# Patient Record
Sex: Male | Born: 1970 | Race: White | Hispanic: No | Marital: Married | Smoking: Never smoker
Health system: Southern US, Community
[De-identification: ages and names within clinical notes are randomized; demographics above are authoritative.]

## PROBLEM LIST (undated history)

## (undated) DIAGNOSIS — E785 Hyperlipidemia, unspecified: Secondary | ICD-10-CM

## (undated) DIAGNOSIS — K219 Gastro-esophageal reflux disease without esophagitis: Secondary | ICD-10-CM

## (undated) DIAGNOSIS — G4733 Obstructive sleep apnea (adult) (pediatric): Secondary | ICD-10-CM

## (undated) DIAGNOSIS — I10 Essential (primary) hypertension: Secondary | ICD-10-CM

## (undated) HISTORY — DX: Essential (primary) hypertension: I10

## (undated) HISTORY — DX: Obstructive sleep apnea (adult) (pediatric): G47.33

## (undated) HISTORY — DX: Hyperlipidemia, unspecified: E78.5

## (undated) HISTORY — DX: Gastro-esophageal reflux disease without esophagitis: K21.9

## (undated) HISTORY — PX: OTHER SURGICAL HISTORY: SHX169

## (undated) HISTORY — PX: APPENDECTOMY: SHX54

---

## 2000-02-06 ENCOUNTER — Emergency Department (HOSPITAL_COMMUNITY): Admission: EM | Admit: 2000-02-06 | Discharge: 2000-02-06 | Payer: Self-pay | Admitting: Emergency Medicine

## 2000-02-06 ENCOUNTER — Encounter: Payer: Self-pay | Admitting: Emergency Medicine

## 2010-06-10 ENCOUNTER — Ambulatory Visit
Admission: RE | Admit: 2010-06-10 | Discharge: 2010-06-10 | Payer: Self-pay | Source: Home / Self Care | Attending: Orthopedic Surgery | Admitting: Orthopedic Surgery

## 2010-06-14 LAB — POCT I-STAT, CHEM 8
BUN: 17 mg/dL (ref 6–23)
Calcium, Ion: 1.09 mmol/L — ABNORMAL LOW (ref 1.12–1.32)
Chloride: 102 mEq/L (ref 96–112)
Creatinine, Ser: 1.2 mg/dL (ref 0.4–1.5)
Glucose, Bld: 110 mg/dL — ABNORMAL HIGH (ref 70–99)
HCT: 43 % (ref 39.0–52.0)
Hemoglobin: 14.6 g/dL (ref 13.0–17.0)
Potassium: 3.5 mEq/L (ref 3.5–5.1)
Sodium: 138 mEq/L (ref 135–145)
TCO2: 29 mmol/L (ref 0–100)

## 2010-06-23 NOTE — Op Note (Addendum)
  NAMEAVIEN, TAHA             ACCOUNT NO.:  [id number]  MEDICAL RECORD NO.:  [id number]          PATIENT TYPE:  AMB  LOCATION:  DSC                          FACILITY:  [facility]  PHYSICIAN:  Loreta Ave, M.D. DATE OF BIRTH:  1970/09/26  DATE OF PROCEDURE:  06/10/2010 DATE OF DISCHARGE:                              OPERATIVE REPORT   PREOPERATIVE DIAGNOSIS:  Right ankle post-traumatic impingement, synovitis, meniscoid lesion.  POSTOPERATIVE DIAGNOSIS:  Right ankle post-traumatic impingement, synovitis, meniscoid lesion.  PROCEDURES:  Right ankle exam under anesthesia with fluoroscopic stress views to confirm stability.  Arthroscopy with synovectomy and excision meniscoid lesion.  SURGEON:  Loreta Ave, MD.  ASSISTANT:  Zonia Kief, PA  ANESTHESIA:  General.  BLOOD LOSS:  Minimal.  SPECIMENS:  None.  CULTURES:  None.  COMPLICATIONS:  None.  DRESSING:  Soft compressive.  TOURNIQUET TIME:  30 minutes.  PROCEDURE:  The patient brought to the operating room, placed on the operating table supine position.  After adequate anesthesia had been obtained with fluoroscopic guidance, the ankle was examined.  He did not open significantly to tilt or drawer.  Once this was confirmed, the stirrup and tourniquet applied.  Prepped and draped in usual sterile fashion.  Exsanguinated with elevation Esmarch.  Tourniquet inflated to 350 mmHg.  Anterolateral, anteromedial portals.  Arthroscope introduced, the ankle distended and inspected.  Considerable synovitis all across the entire front of the ankle.  On the lateral side, this was thickened into a meniscoid lesion interposing in the tibiotalar joint.  All of that completely excised.  Also some overgrowth of synovitis at the bottom of the syndesmosis.  That was debrided.  When that was complete, the entire ankle examined.  Articular cartilage looked great throughout. No osteochondral lesions.  No chondromalacia.  No loose  bodies.  Full dorsiflexion without bony impingement confirmed.  Instruments and fluid removed.  Portals injected with Marcaine along the ankle.  Portals closed with nylon.  Sterile compressive dressing applied.  Tourniquet was inflated and removed. Anesthesia reversed.  Brought to recovery room.  Tolerated surgery well. No complications.     Loreta Ave, M.D.     DFM/MEDQ  D:  06/10/2010  T:  06/11/2010  Job:  [id number]  Electronically Signed by Mckinley Jewel M.D. on 06/23/2010 04:20:52 PM

## 2012-09-25 ENCOUNTER — Encounter: Payer: Self-pay | Admitting: Family Medicine

## 2012-09-25 ENCOUNTER — Ambulatory Visit (INDEPENDENT_AMBULATORY_CARE_PROVIDER_SITE_OTHER): Payer: Self-pay | Admitting: Family Medicine

## 2012-09-25 VITALS — BP 136/89 | HR 59 | Temp 97.8°F | Ht 72.0 in | Wt 226.4 lb

## 2012-09-25 DIAGNOSIS — Z0000 Encounter for general adult medical examination without abnormal findings: Secondary | ICD-10-CM

## 2012-09-25 DIAGNOSIS — K219 Gastro-esophageal reflux disease without esophagitis: Secondary | ICD-10-CM

## 2012-09-25 DIAGNOSIS — Z0289 Encounter for other administrative examinations: Secondary | ICD-10-CM

## 2012-09-25 DIAGNOSIS — I10 Essential (primary) hypertension: Secondary | ICD-10-CM

## 2012-09-25 LAB — POCT URINALYSIS DIPSTICK
Bilirubin, UA: NEGATIVE
Blood, UA: NEGATIVE
Glucose, UA: NEGATIVE
Ketones, UA: NEGATIVE
Leukocytes, UA: NEGATIVE
Nitrite, UA: NEGATIVE
Protein, UA: NEGATIVE
Spec Grav, UA: <=1.005
Urobilinogen, UA: NEGATIVE
pH, UA: 7.5

## 2012-09-25 MED ORDER — LOSARTAN POTASSIUM 50 MG PO TABS: 50.0000 mg | ORAL_TABLET | Freq: Every day | ORAL | Status: DC

## 2012-09-25 MED ORDER — ESOMEPRAZOLE MAGNESIUM 40 MG PO CPDR: 40.0000 mg | DELAYED_RELEASE_CAPSULE | Freq: Every day | ORAL | Status: DC

## 2012-09-25 MED ORDER — AMLODIPINE BESYLATE 2.5 MG PO TABS: 2.5000 mg | ORAL_TABLET | Freq: Every day | ORAL | Status: DC

## 2012-09-25 NOTE — Progress Notes (Signed)
Patient ID: Dalton Cummings, male   DOB: 19-Jul-1970, 42 y.o.   MRN: [id number] Performed DOT physical SUBJECTIVE: HPI: Had a delay in approval because of high BP. Now under control. Repeat PEX. Feels fine.  PMH/PSH: reviewed/updated in Epic  SH/FH: reviewed/updated in Epic  Allergies: reviewed/updated in Epic  Medications: reviewed/updated in Epic  Immunizations: reviewed/updated in Epic  ROS: As above in the HPI. All other systems are stable or negative.  OBJECTIVE: APPEARANCE:  Patient in no acute distress.The patient appeared well nourished and normally developed. Acyanotic. Waist: VITAL SIGNS:BP 136/89  Pulse 59  Temp(Src) 97.8 F (36.6 C) (Oral)  Ht 6' (1.829 m)  Wt 226 lb 6.4 oz (102.694 kg)  BMI 30.7 kg/m2   SKIN: warm and  Dry without overt rashes, tattoos and scars  HEAD and Neck: without JVD, Head and scalp: normal Eyes:No scleral icterus. Fundi normal, eye movements normal. Ears: Auricle normal, canal normal, Tympanic membranes normal, insufflation normal. Nose: normal Throat: normal Neck & thyroid: normal  CHEST & LUNGS: Chest wall: normal Lungs: Clear  CVS: Reveals the PMI to be normally located. Regular rhythm, First and Second Heart sounds are normal,  absence of murmurs, rubs or gallops. Peripheral vasculature: Radial pulses: normal Dorsal pedis pulses: normal Posterior pulses: normal  ABDOMEN:  Appearance: normal Benign,, no organomegaly, no masses, no Abdominal Aortic enlargement. No Guarding , no rebound. No Bruits. Bowel sounds: normal  RECTAL: N/A GU: N/A  EXTREMETIES: nonedematous. Both Femoral and Pedal pulses are normal.  MUSCULOSKELETAL:  Spine: normal Joints: intact  NEUROLOGIC: oriented to time,place and person; nonfocal. Strength is normal Sensory is normal Reflexes are normal Cranial Nerves are normal.  ASSESSMENT: Annual physical exam - Plan: POCT urinalysis dipstick  HTN (hypertension) - Plan: losartan  (COZAAR) 50 MG tablet, amLODipine (NORVASC) 2.5 MG tablet  GERD (gastroesophageal reflux disease) - Plan: esomeprazole (NEXIUM) 40 MG capsule  Stable and controlled.  PLAN: Approved for 1 year since his pex was repeated. Orders Placed This Encounter  Procedures  . POCT urinalysis dipstick   Meds ordered this encounter  Medications  . DISCONTD: amLODipine (NORVASC) 2.5 MG tablet    Sig:   . DISCONTD: NEXIUM 40 MG capsule    Sig:   . DISCONTD: losartan (COZAAR) 50 MG tablet    Sig:   . esomeprazole (NEXIUM) 40 MG capsule    Sig: Take 1 capsule (40 mg total) by mouth daily before breakfast.    Dispense:  30 capsule    Refill:  11  . losartan (COZAAR) 50 MG tablet    Sig: Take 1 tablet (50 mg total) by mouth daily.    Dispense:  30 tablet    Refill:  11  . amLODipine (NORVASC) 2.5 MG tablet    Sig: Take 1 tablet (2.5 mg total) by mouth daily.    Dispense:  30 tablet    Refill:  11   RTc in 1 year for DOT PEX  RTC in 3 months for BP follow up.  Forms filled.  Kipper Buch P. [place] Charon, M.D.

## 2013-01-08 ENCOUNTER — Ambulatory Visit (INDEPENDENT_AMBULATORY_CARE_PROVIDER_SITE_OTHER): Payer: BC Managed Care – PPO | Admitting: Family Medicine

## 2013-01-08 ENCOUNTER — Encounter: Payer: Self-pay | Admitting: Family Medicine

## 2013-01-08 VITALS — BP 141/94 | HR 71 | Temp 97.9°F | Ht 72.0 in | Wt 231.2 lb

## 2013-01-08 DIAGNOSIS — E785 Hyperlipidemia, unspecified: Secondary | ICD-10-CM

## 2013-01-08 DIAGNOSIS — I10 Essential (primary) hypertension: Secondary | ICD-10-CM

## 2013-01-08 NOTE — Progress Notes (Signed)
Patient ID: Dalton Cummings, male   DOB: 1970-08-18, 42 y.o.   MRN: [id number] SUBJECTIVE: CC: Chief Complaint  Patient presents with  . Follow-up    4 month follow up reck BP     HPI: Patient is here for follow up of hypertension: denies Headache;deniesChest Pain;denies weakness;denies Shortness of Breath or Orthopnea;denies Visual changes;denies palpitations;denies cough;denies pedal edema;denies symptoms of TIA or stroke; admits to Compliance with medications. denies Problems with medications. Wife checks BP are normal.  Past Medical History  Diagnosis Date  . Hypertension   . GERD (gastroesophageal reflux disease)    Past Surgical History  Procedure Laterality Date  . Appendectomy    . Right ankle  repair    . Left elbow surgery    . Left rotator cuff     History   Social History  . Marital Status: Married    Spouse Name: N/A    Number of Children: N/A  . Years of Education: N/A   Occupational History  . Not on file.   Social History Main Topics  . Smoking status: Never Smoker   . Smokeless tobacco: Not on file  . Alcohol Use: No  . Drug Use: No  . Sexually Active: Not on file   Other Topics Concern  . Not on file   Social History Narrative  . No narrative on file   Family History  Problem Relation Age of Onset  . Diabetes Father   . Heart disease Father    Current Outpatient Prescriptions on File Prior to Visit  Medication Sig Dispense Refill  . amLODipine (NORVASC) 2.5 MG tablet Take 1 tablet (2.5 mg total) by mouth daily.  30 tablet  11  . esomeprazole (NEXIUM) 40 MG capsule Take 1 capsule (40 mg total) by mouth daily before breakfast.  30 capsule  11  . losartan (COZAAR) 50 MG tablet Take 1 tablet (50 mg total) by mouth daily.  30 tablet  11   No current facility-administered medications on file prior to visit.   Allergies  Allergen Reactions  . Codeine   . Penicillins     There is no immunization history on file for this patient. Prior to  Admission medications   Medication Sig Start Date End Date Taking? Authorizing Provider  amLODipine (NORVASC) 2.5 MG tablet Take 1 tablet (2.5 mg total) by mouth daily. 09/25/12  Yes Ileana Ladd, MD  esomeprazole (NEXIUM) 40 MG capsule Take 1 capsule (40 mg total) by mouth daily before breakfast. 09/25/12  Yes Ileana Ladd, MD  losartan (COZAAR) 50 MG tablet Take 1 tablet (50 mg total) by mouth daily. 09/25/12  Yes Ileana Ladd, MD     ROS: As above in the HPI. All other systems are stable or negative.  OBJECTIVE: APPEARANCE:  Patient in no acute distress.The patient appeared well nourished and normally developed. Acyanotic. Waist: VITAL SIGNS:BP 141/94  Pulse 71  Temp(Src) 97.9 F (36.6 C) (Oral)  Ht 6' (1.829 m)  Wt 231 lb 3.2 oz (104.872 kg)  BMI 31.35 kg/m2 WM Recheck BP 126/82 Muscular. SKIN: warm and  Dry without overt rashes, tattoos and scars  HEAD and Neck: without JVD, Head and scalp: normal Eyes:No scleral icterus. Fundi normal, eye movements normal. Ears: Auricle normal, canal normal, Tympanic membranes normal, insufflation normal. Nose: normal Throat: normal Neck & thyroid: normal  CHEST & LUNGS: Chest wall: normal Lungs: Clear  CVS: Reveals the PMI to be normally located. Regular rhythm, First and Second Heart sounds are  normal,  absence of murmurs, rubs or gallops. Peripheral vasculature: Radial pulses: normal Dorsal pedis pulses: normal Posterior pulses: normal  ABDOMEN:  Appearance: normal Benign, no organomegaly, no masses, no Abdominal Aortic enlargement. No Guarding , no rebound. No Bruits. Bowel sounds: normal  RECTAL: N/A GU: N/A  EXTREMETIES: nonedematous. Both Femoral and Pedal pulses are normal.  MUSCULOSKELETAL:  Spine: normal Joints: intact  NEUROLOGIC: oriented to time,place and person; nonfocal. Strength is normal Sensory is normal Reflexes are normal Cranial Nerves are normal.  Results for orders placed in visit  on 09/25/12  POCT URINALYSIS DIPSTICK      Result Value Range   Color, UA yellow     Clarity, UA clear     Glucose, UA neg     Bilirubin, UA neg     Ketones, UA neg     Spec Grav, UA <=1.005     Blood, UA neg     pH, UA 7.5     Protein, UA neg     Urobilinogen, UA negative     Nitrite, UA neg     Leukocytes, UA Negative      ASSESSMENT: HLD (hyperlipidemia) - Plan: NMR, lipoprofile  HTN (hypertension) - Plan: CMP14+EGFR  PLAN: Orders Placed This Encounter  Procedures  . CMP14+EGFR  . NMR, lipoprofile   Reinforced the dietary changes he needs to make.  Continue the same medications.  Return in about 4 months (around 05/10/2013) for Recheck medical problems.  Jeraldin Fesler P. [place] Charon, M.D.

## 2013-01-09 LAB — CMP14+EGFR
ALT: 43 IU/L (ref 0–44)
AST: 31 IU/L (ref 0–40)
Albumin/Globulin Ratio: 2 (ref 1.1–2.5)
Albumin: 4.8 g/dL (ref 3.5–5.5)
Alkaline Phosphatase: 63 IU/L (ref 39–117)
BUN/Creatinine Ratio: 14 (ref 9–20)
BUN: 14 mg/dL (ref 6–24)
CO2: 28 mmol/L (ref 18–29)
Calcium: 10.4 mg/dL — ABNORMAL HIGH (ref 8.7–10.2)
Chloride: 101 mmol/L (ref 97–108)
Creatinine, Ser: 1.02 mg/dL (ref 0.76–1.27)
GFR calc Af Amer: 104 mL/min/{1.73_m2} (ref >59)
GFR calc non Af Amer: 90 mL/min/{1.73_m2} (ref >59)
Globulin, Total: 2.4 g/dL (ref 1.5–4.5)
Glucose: 109 mg/dL — ABNORMAL HIGH (ref 65–99)
Potassium: 5 mmol/L (ref 3.5–5.2)
Sodium: 140 mmol/L (ref 134–144)
Total Bilirubin: 0.7 mg/dL (ref 0.0–1.2)
Total Protein: 7.2 g/dL (ref 6.0–8.5)

## 2013-01-10 ENCOUNTER — Other Ambulatory Visit: Payer: Self-pay | Admitting: Family Medicine

## 2013-01-10 DIAGNOSIS — E785 Hyperlipidemia, unspecified: Secondary | ICD-10-CM

## 2013-01-10 LAB — NMR, LIPOPROFILE
Cholesterol: 201 mg/dL — ABNORMAL HIGH (ref <200)
HDL Cholesterol by NMR: 45 mg/dL (ref >=40)
HDL Particle Number: 35 umol/L (ref >=30.5)
LDL Particle Number: 2199 nmol/L — ABNORMAL HIGH (ref <1000)
LDL Size: 20 nm — ABNORMAL LOW (ref >20.5)
LDLC SERPL CALC-MCNC: 106 mg/dL — ABNORMAL HIGH (ref <100)
LP-IR Score: 87 — ABNORMAL HIGH (ref <=45)
Small LDL Particle Number: 1557 nmol/L — ABNORMAL HIGH (ref <=527)
Triglycerides by NMR: 249 mg/dL — ABNORMAL HIGH (ref <150)

## 2013-01-10 MED ORDER — PRAVASTATIN SODIUM 20 MG PO TABS: 20.0000 mg | ORAL_TABLET | Freq: Every day | ORAL | Status: DC

## 2013-05-16 ENCOUNTER — Ambulatory Visit: Payer: BC Managed Care – PPO | Admitting: Family Medicine

## 2013-06-07 ENCOUNTER — Ambulatory Visit: Payer: BC Managed Care – PPO | Admitting: Family Medicine

## 2013-06-14 ENCOUNTER — Ambulatory Visit: Payer: BC Managed Care – PPO | Admitting: Family Medicine

## 2013-06-17 ENCOUNTER — Ambulatory Visit (INDEPENDENT_AMBULATORY_CARE_PROVIDER_SITE_OTHER): Payer: BC Managed Care – PPO | Admitting: Family Medicine

## 2013-06-17 ENCOUNTER — Encounter: Payer: Self-pay | Admitting: Family Medicine

## 2013-06-17 VITALS — BP 143/83 | HR 72 | Temp 98.8°F | Ht 72.0 in | Wt 229.0 lb

## 2013-06-17 DIAGNOSIS — E663 Overweight: Secondary | ICD-10-CM | POA: Insufficient documentation

## 2013-06-17 DIAGNOSIS — K219 Gastro-esophageal reflux disease without esophagitis: Secondary | ICD-10-CM | POA: Insufficient documentation

## 2013-06-17 DIAGNOSIS — I10 Essential (primary) hypertension: Secondary | ICD-10-CM

## 2013-06-17 DIAGNOSIS — E785 Hyperlipidemia, unspecified: Secondary | ICD-10-CM | POA: Insufficient documentation

## 2013-06-17 MED ORDER — ASPIRIN EC 81 MG PO TBEC: 81.0000 mg | DELAYED_RELEASE_TABLET | Freq: Every day | ORAL | Status: DC

## 2013-06-17 NOTE — Patient Instructions (Addendum)
Dr Paula Libra Recommendations  For nutrition information, I recommend books:  1).Eat to Live by Dr Excell Seltzer. 2).Prevent and Reverse Heart Disease by Dr Karl Luke. 3) Dr Janene Harvey Book:  Program to Reverse Diabetes  Exercise recommendations are:  If unable to walk, then the patient can exercise in a chair 3 times a day. By flapping arms like a bird gently and raising legs outwards to the front.  If ambulatory, the patient can go for walks for 30 minutes 3 times a week. Then increase the intensity and duration as tolerated.  Goal is to try to attain exercise frequency to 5 times a week.  If applicable: Best to perform resistance exercises (machines or weights) 2 days a week and cardio type exercises 3 days per week.   DASH Diet The DASH diet stands for "Dietary Approaches to Stop Hypertension." It is a healthy eating plan that has been shown to reduce high blood pressure (hypertension) in as little as 14 days, while also possibly providing other significant health benefits. These other health benefits include reducing the risk of breast cancer after menopause and reducing the risk of type 2 diabetes, heart disease, colon cancer, and stroke. Health benefits also include weight loss and slowing kidney failure in patients with chronic kidney disease.  DIET GUIDELINES  Limit salt (sodium). Your diet should contain less than 1500 mg of sodium daily.  Limit refined or processed carbohydrates. Your diet should include mostly whole grains. Desserts and added sugars should be used sparingly.  Include small amounts of heart-healthy fats. These types of fats include nuts, oils, and tub margarine. Limit saturated and trans fats. These fats have been shown to be harmful in the body. CHOOSING FOODS  The following food groups are based on a 2000 calorie diet. See your Registered Dietitian for individual calorie needs. Grains and Grain Products (6 to 8 servings daily)  Eat  More Often: Whole-wheat bread, brown rice, whole-grain or wheat pasta, quinoa, popcorn without added fat or salt (air popped).  Eat Less Often: White bread, white pasta, white rice, cornbread. Vegetables (4 to 5 servings daily)  Eat More Often: Fresh, frozen, and canned vegetables. Vegetables may be raw, steamed, roasted, or grilled with a minimal amount of fat.  Eat Less Often/Avoid: Creamed or fried vegetables. Vegetables in a cheese sauce. Fruit (4 to 5 servings daily)  Eat More Often: All fresh, canned (in natural juice), or frozen fruits. Dried fruits without added sugar. One hundred percent fruit juice ( cup [237 mL] daily).  Eat Less Often: Dried fruits with added sugar. Canned fruit in light or heavy syrup. [employer], Fish, and Poultry (2 servings or less daily. One serving is 3 to 4 oz [85-114 g]).  Eat More Often: Ninety percent or leaner ground beef, tenderloin, sirloin. Round cuts of beef, chicken breast, [place] breast. All fish. Grill, bake, or broil your meat. Nothing should be fried.  Eat Less Often/Avoid: Fatty cuts of meat, [place], or chicken leg, thigh, or wing. Fried cuts of meat or fish. Dairy (2 to 3 servings)  Eat More Often: Low-fat or fat-free milk, low-fat plain or light yogurt, reduced-fat or part-skim cheese.  Eat Less Often/Avoid: Milk (whole, 2%).Whole milk yogurt. Full-fat cheeses. Nuts, Seeds, and Legumes (4 to 5 servings per week)  Eat More Often: All without added salt.  Eat Less Often/Avoid: Salted nuts and seeds, canned beans with added salt. Fats and Sweets (limited)  Eat More Often: Vegetable oils, tub  margarines without trans fats, sugar-free gelatin. Mayonnaise and salad dressings.  Eat Less Often/Avoid: Coconut oils, palm oils, butter, stick margarine, cream, half and half, cookies, candy, pie. FOR MORE INFORMATION The Dash Diet Eating Plan: www.dashdiet.org Document Released: 05/05/2011 Document Revised: 08/08/2011 Document Reviewed:  05/05/2011 [facility] Patient Information 2014 [place], [place].   Hypertriglyceridemia  Diet for High blood levels of Triglycerides Most fats in food are triglycerides. Triglycerides in your blood are stored as fat in your body. High levels of triglycerides in your blood may put you at a greater risk for heart disease and stroke.  Normal triglyceride levels are less than 150 mg/dL. Borderline high levels are 150-199 mg/dl. High levels are 200 - 499 mg/dL, and very high triglyceride levels are greater than 500 mg/dL. The decision to treat high triglycerides is generally based on the level. For people with borderline or high triglyceride levels, treatment includes weight loss and exercise. Drugs are recommended for people with very high triglyceride levels. Many people who need treatment for high triglyceride levels have metabolic syndrome. This syndrome is a collection of disorders that often include: insulin resistance, high blood pressure, blood clotting problems, high cholesterol and triglycerides. TESTING PROCEDURE FOR TRIGLYCERIDES  You should not eat 4 hours before getting your triglycerides measured. The normal range of triglycerides is between 10 and 250 milligrams per deciliter (mg/dl). Some people may have extreme levels (1000 or above), but your triglyceride level may be too high if it is above 150 mg/dl, depending on what other risk factors you have for heart disease.  People with high blood triglycerides may also have high blood cholesterol levels. If you have high blood cholesterol as well as high blood triglycerides, your risk for heart disease is probably greater than if you only had high triglycerides. High blood cholesterol is one of the main risk factors for heart disease. CHANGING YOUR DIET  Your weight can affect your blood triglyceride level. If you are more than 20% above your ideal body weight, you may be able to lower your blood triglycerides by losing weight. Eating less and  exercising regularly is the best way to combat this. Fat provides more calories than any other food. The best way to lose weight is to eat less fat. Only 30% of your total calories should come from fat. Less than 7% of your diet should come from saturated fat. A diet low in fat and saturated fat is the same as a diet to decrease blood cholesterol. By eating a diet lower in fat, you may lose weight, lower your blood cholesterol, and lower your blood triglyceride level.  Eating a diet low in fat, especially saturated fat, may also help you lower your blood triglyceride level. Ask your dietitian to help you figure how much fat you can eat based on the number of calories your caregiver has prescribed for you.  Exercise, in addition to helping with weight loss may also help lower triglyceride levels.   Alcohol can increase blood triglycerides. You may need to stop drinking alcoholic beverages.  Too much carbohydrate in your diet may also increase your blood triglycerides. Some complex carbohydrates are necessary in your diet. These may include bread, rice, potatoes, other starchy vegetables and cereals.  Reduce "simple" carbohydrates. These may include pure sugars, candy, honey, and jelly without losing other nutrients. If you have the kind of high blood triglycerides that is affected by the amount of carbohydrates in your diet, you will need to eat less sugar and less high-sugar foods.  Your caregiver can help you with this.  Adding 2-4 grams of fish oil (EPA+ DHA) may also help lower triglycerides. Speak with your caregiver before adding any supplements to your regimen. Following the Diet  Maintain your ideal weight. Your caregivers can help you with a diet. Generally, eating less food and getting more exercise will help you lose weight. Joining a weight control group may also help. Ask your caregivers for a good weight control group in your area.  Eat low-fat foods instead of high-fat foods. This can help  you lose weight too.  These foods are lower in fat. Eat MORE of these:   Dried beans, peas, and lentils.  Egg whites.  Low-fat cottage cheese.  Fish.  Lean cuts of meat, such as round, sirloin, rump, and flank (cut extra fat off meat you fix).  Whole grain breads, cereals and pasta.  Skim and nonfat dry milk.  Low-fat yogurt.  Poultry without the skin.  Cheese made with skim or part-skim milk, such as mozzarella, parmesan, farmers', ricotta, or pot cheese. These are higher fat foods. Eat LESS of these:   Whole milk and foods made from whole milk, such as American, blue, cheddar, monterey jack, and swiss cheese  High-fat meats, such as luncheon meats, sausages, knockwurst, bratwurst, hot dogs, ribs, corned beef, ground pork, and regular ground beef.  Fried foods. Limit saturated fats in your diet. Substituting unsaturated fat for saturated fat may decrease your blood triglyceride level. You will need to read package labels to know which products contain saturated fats.  These foods are high in saturated fat. Eat LESS of these:   Fried pork skins.  Whole milk.  Skin and fat from poultry.  Palm oil.  Butter.  Shortening.  Cream cheese.  Tomasa BlaseBacon.  Margarines and baked goods made from listed oils.  Vegetable shortenings.  Chitterlings.  Fat from meats.  Coconut oil.  Palm kernel oil.  Lard.  Cream.  Sour cream.  Fatback.  Coffee whiteners and non-dairy creamers made with these oils.  Cheese made from whole milk. Use unsaturated fats (both polyunsaturated and monounsaturated) moderately. Remember, even though unsaturated fats are better than saturated fats; you still want a diet low in total fat.  These foods are high in unsaturated fat:   Canola oil.  Sunflower oil.  Mayonnaise.  Almonds.  Peanuts.  Pine nuts.  Margarines made with these oils.  Safflower oil.  Olive oil.  Avocados.  Cashews.  Peanut butter.  Sunflower  seeds.  Soybean oil.  Peanut oil.  Olives.  Pecans.  Walnuts.  Pumpkin seeds. Avoid sugar and other high-sugar foods. This will decrease carbohydrates without decreasing other nutrients. Sugar in your food goes rapidly to your blood. When there is excess sugar in your blood, your liver may use it to make more triglycerides. Sugar also contains calories without other important nutrients.  Eat LESS of these:   Sugar, brown sugar, powdered sugar, jam, jelly, preserves, honey, syrup, molasses, pies, candy, cakes, cookies, frosting, pastries, colas, soft drinks, punches, fruit drinks, and regular gelatin.  Avoid alcohol. Alcohol, even more than sugar, may increase blood triglycerides. In addition, alcohol is high in calories and low in nutrients. Ask for sparkling water, or a diet soft drink instead of an alcoholic beverage. Suggestions for planning and preparing meals   Bake, broil, grill or roast meats instead of frying.  Remove fat from meats and skin from poultry before cooking.  Add spices, herbs, lemon juice or vinegar to vegetables instead  of salt, rich sauces or gravies.  Use a non-stick skillet without fat or use no-stick sprays.  Cool and refrigerate stews and broth. Then remove the hardened fat floating on the surface before serving.  Refrigerate meat drippings and skim off fat to make low-fat gravies.  Serve more fish.  Use less butter, margarine and other high-fat spreads on bread or vegetables.  Use skim or reconstituted non-fat dry milk for cooking.  Cook with low-fat cheeses.  Substitute low-fat yogurt or cottage cheese for all or part of the sour cream in recipes for sauces, dips or congealed salads.  Use half yogurt/half mayonnaise in salad recipes.  Substitute evaporated skim milk for cream. Evaporated skim milk or reconstituted non-fat dry milk can be whipped and substituted for whipped cream in certain recipes.  Choose fresh fruits for dessert instead of  high-fat foods such as pies or cakes. Fruits are naturally low in fat. When Dining Out   Order low-fat appetizers such as fruit or vegetable juice, pasta with vegetables or tomato sauce.  Select clear, rather than cream soups.  Ask that dressings and gravies be served on the side. Then use less of them.  Order foods that are baked, broiled, poached, steamed, stir-fried, or roasted.  Ask for margarine instead of butter, and use only a small amount.  Drink sparkling water, unsweetened tea or coffee, or diet soft drinks instead of alcohol or other sweet beverages. QUESTIONS AND ANSWERS ABOUT OTHER FATS IN THE BLOOD: SATURATED FAT, TRANS FAT, AND CHOLESTEROL What is trans fat? Trans fat is a type of fat that is formed when vegetable oil is hardened through a process called hydrogenation. This process helps makes foods more solid, gives them shape, and prolongs their shelf life. Trans fats are also called hydrogenated or partially hydrogenated oils.  What do saturated fat, trans fat, and cholesterol in foods have to do with heart disease? Saturated fat, trans fat, and cholesterol in the diet all raise the level of LDL "bad" cholesterol in the blood. The higher the LDL cholesterol, the greater the risk for coronary heart disease (CHD). Saturated fat and trans fat raise LDL similarly.  What foods contain saturated fat, trans fat, and cholesterol? High amounts of saturated fat are found in animal products, such as fatty cuts of meat, chicken skin, and full-fat dairy products like butter, whole milk, cream, and cheese, and in tropical vegetable oils such as palm, palm kernel, and coconut oil. Trans fat is found in some of the same foods as saturated fat, such as vegetable shortening, some margarines (especially hard or stick margarine), crackers, cookies, baked goods, fried foods, salad dressings, and other processed foods made with partially hydrogenated vegetable oils. Small amounts of trans fat also  occur naturally in some animal products, such as milk products, beef, and lamb. Foods high in cholesterol include liver, other organ meats, egg yolks, shrimp, and full-fat dairy products. How can I use the new food label to make heart-healthy food choices? Check the Nutrition Facts panel of the food label. Choose foods lower in saturated fat, trans fat, and cholesterol. For saturated fat and cholesterol, you can also use the Percent Daily Value (%DV): 5% DV or less is low, and 20% DV or more is high. (There is no %DV for trans fat.) Use the Nutrition Facts panel to choose foods low in saturated fat and cholesterol, and if the trans fat is not listed, read the ingredients and limit products that list shortening or hydrogenated or partially hydrogenated  vegetable oil, which tend to be high in trans fat. POINTS TO REMEMBER:   Discuss your risk for heart disease with your caregivers, and take steps to reduce risk factors.  Change your diet. Choose foods that are low in saturated fat, trans fat, and cholesterol.  Add exercise to your daily routine if it is not already being done. Participate in physical activity of moderate intensity, like brisk walking, for at least 30 minutes on most, and preferably all days of the week. No time? Break the 30 minutes into three, 10-minute segments during the day.  Stop smoking. If you do smoke, contact your caregiver to discuss ways in which they can help you quit.  Do not use street drugs.  Maintain a normal weight.  Maintain a healthy blood pressure.  Keep up with your blood work for checking the fats in your blood as directed by your caregiver. Document Released: 03/03/2004 Document Revised: 11/15/2011 Document Reviewed: 09/29/2008 [facility] Patient Information 2014 [place], [place].   Gastroesophageal Reflux Disease, Adult Gastroesophageal reflux disease (GERD) happens when acid from your stomach flows up into the esophagus. When acid comes in contact with  the esophagus, the acid causes soreness (inflammation) in the esophagus. Over time, GERD may create small holes (ulcers) in the lining of the esophagus. CAUSES   Increased body weight. This puts pressure on the stomach, making acid rise from the stomach into the esophagus.  Smoking. This increases acid production in the stomach.  Drinking alcohol. This causes decreased pressure in the lower esophageal sphincter (valve or ring of muscle between the esophagus and stomach), allowing acid from the stomach into the esophagus.  Late evening meals and a full stomach. This increases pressure and acid production in the stomach.  A malformed lower esophageal sphincter. Sometimes, no cause is found. SYMPTOMS   Burning pain in the lower part of the mid-chest behind the breastbone and in the mid-stomach area. This may occur twice a week or more often.  Trouble swallowing.  Sore throat.  Dry cough.  Asthma-like symptoms including chest tightness, shortness of breath, or wheezing. DIAGNOSIS  Your caregiver may be able to diagnose GERD based on your symptoms. In some cases, X-rays and other tests may be done to check for complications or to check the condition of your stomach and esophagus. TREATMENT  Your caregiver may recommend over-the-counter or prescription medicines to help decrease acid production. Ask your caregiver before starting or adding any new medicines.  HOME CARE INSTRUCTIONS   Change the factors that you can control. Ask your caregiver for guidance concerning weight loss, quitting smoking, and alcohol consumption.  Avoid foods and drinks that make your symptoms worse, such as:  Caffeine or alcoholic drinks.  Chocolate.  Peppermint or mint flavorings.  Garlic and onions.  Spicy foods.  Citrus fruits, such as oranges, lemons, or limes.  Tomato-based foods such as sauce, chili, salsa, and pizza.  Fried and fatty foods.  Avoid lying down for the 3 hours prior to your  bedtime or prior to taking a nap.  Eat small, frequent meals instead of large meals.  Wear loose-fitting clothing. Do not wear anything tight around your waist that causes pressure on your stomach.  Raise the head of your bed 6 to 8 inches with wood blocks to help you sleep. Extra pillows will not help.  Only take over-the-counter or prescription medicines for pain, discomfort, or fever as directed by your caregiver.  Do not take aspirin, ibuprofen, or other nonsteroidal anti-inflammatory drugs (NSAIDs).  SEEK IMMEDIATE MEDICAL CARE IF:   You have pain in your arms, neck, jaw, teeth, or back.  Your pain increases or changes in intensity or duration.  You develop nausea, vomiting, or sweating (diaphoresis).  You develop shortness of breath, or you faint.  Your vomit is green, yellow, black, or looks like coffee grounds or blood.  Your stool is red, bloody, or black. These symptoms could be signs of other problems, such as heart disease, gastric bleeding, or esophageal bleeding. MAKE SURE YOU:   Understand these instructions.  Will watch your condition.  Will get help right away if you are not doing well or get worse. Document Released: 02/23/2005 Document Revised: 08/08/2011 Document Reviewed: 12/03/2010 [facility] Patient Information 2014 [place], [place].

## 2013-06-17 NOTE — Progress Notes (Signed)
Patient ID: Dalton Cummings, male   DOB: [birth year], 43 y.o.   MRN: [id number] SUBJECTIVE: CC: Chief Complaint  Patient presents with  . Follow-up    4 monthfollow up     HPI: Patient is here for follow up of hyperlipidemia/HTN denies Headache;denies Chest Pain;denies weakness;denies Shortness of Breath and orthopnea;denies Visual changes;denies palpitations;denies cough;denies pedal edema;denies symptoms of TIA or stroke;deniesClaudication symptoms. admits to Compliance with medications; denies Problems with medications.    Past Medical History  Diagnosis Date  . Hypertension   . GERD (gastroesophageal reflux disease)   . Hyperlipidemia    Past Surgical History  Procedure Laterality Date  . Appendectomy    . Right ankle  repair    . Left elbow surgery    . Left rotator cuff     History   Social History  . Marital Status: Married    Spouse Name: N/A    Number of Children: N/A  . Years of Education: N/A   Occupational History  . Not on file.   Social History Main Topics  . Smoking status: Never Smoker   . Smokeless tobacco: Not on file  . Alcohol Use: No  . Drug Use: No  . Sexual Activity: Not on file   Other Topics Concern  . Not on file   Social History Narrative  . No narrative on file   Family History  Problem Relation Age of Onset  . Diabetes Father   . Heart disease Father    Current Outpatient Prescriptions on File Prior to Visit  Medication Sig Dispense Refill  . amLODipine (NORVASC) 2.5 MG tablet Take 1 tablet (2.5 mg total) by mouth daily.  30 tablet  11  . esomeprazole (NEXIUM) 40 MG capsule Take 1 capsule (40 mg total) by mouth daily before breakfast.  30 capsule  11  . losartan (COZAAR) 50 MG tablet Take 1 tablet (50 mg total) by mouth daily.  30 tablet  11  . pravastatin (PRAVACHOL) 20 MG tablet Take 1 tablet (20 mg total) by mouth daily.  90 tablet  3   No current facility-administered medications on file prior to visit.   Allergies   Allergen Reactions  . Codeine   . Penicillins    Immunization History  Administered Date(s) Administered  . Influenza-Unspecified 03/30/2013  . Tdap 10/26/2011   Prior to Admission medications   Medication Sig Start Date End Date Taking? Authorizing Provider  amLODipine (NORVASC) 2.5 MG tablet Take 1 tablet (2.5 mg total) by mouth daily. 09/25/12  Yes Vernie Shanks, MD  esomeprazole (NEXIUM) 40 MG capsule Take 1 capsule (40 mg total) by mouth daily before breakfast. 09/25/12  Yes Vernie Shanks, MD  losartan (COZAAR) 50 MG tablet Take 1 tablet (50 mg total) by mouth daily. 09/25/12  Yes Vernie Shanks, MD  pravastatin (PRAVACHOL) 20 MG tablet Take 1 tablet (20 mg total) by mouth daily. 01/10/13  Yes Vernie Shanks, MD     ROS: As above in the HPI. All other systems are stable or negative.  OBJECTIVE: APPEARANCE:  Patient in no acute distress.The patient appeared well nourished and normally developed. Acyanotic. Waist: VITAL SIGNS:BP 143/83  Pulse 72  Temp(Src) 98.8 F (37.1 C) (Oral)  Ht 6' (1.829 m)  Wt 229 lb (103.874 kg)  BMI 31.05 kg/m2  BP 122/84 RA; 124/86 LA  WM overweight, muscular.  SKIN: warm and  Dry without overt rashes, tattoos and scars  HEAD and Neck: without JVD, Head and scalp: normal  Eyes:No scleral icterus. Fundi normal, eye movements normal. Ears: Auricle normal, canal normal, Tympanic membranes normal, insufflation normal. Nose: normal Throat: normal Neck & thyroid: normal  CHEST & LUNGS: Chest wall: normal Lungs: Clear  CVS: Reveals the PMI to be normally located. Regular rhythm, First and Second Heart sounds are normal,  absence of murmurs, rubs or gallops. Peripheral vasculature: Radial pulses: normal Dorsal pedis pulses: normal Posterior pulses: normal  ABDOMEN:  Appearance: central fat distribution. Benign, no organomegaly, no masses, no Abdominal Aortic enlargement. No Guarding , no rebound. No Bruits. Bowel sounds:  normal  RECTAL: N/A GU: N/A  EXTREMETIES: nonedematous.  MUSCULOSKELETAL:  Spine: normal Joints: intact  NEUROLOGIC: oriented to time,place and person; nonfocal. Strength is normal Sensory is normal Reflexes are normal Cranial Nerves are normal.   Results for orders placed in visit on 01/08/13  CMP14+EGFR      Result Value Range   Glucose 109 (*) 65 - 99 mg/dL   BUN 14  6 - 24 mg/dL   Creatinine, Ser 1.02  0.76 - 1.27 mg/dL   GFR calc non Af Amer 90  >59 mL/min/1.73   GFR calc Af Amer 104  >59 mL/min/1.73   BUN/Creatinine Ratio 14  9 - 20   Sodium 140  134 - 144 mmol/L   Potassium 5.0  3.5 - 5.2 mmol/L   Chloride 101  97 - 108 mmol/L   CO2 28  18 - 29 mmol/L   Calcium 10.4 (*) 8.7 - 10.2 mg/dL   Total Protein 7.2  6.0 - 8.5 g/dL   Albumin 4.8  3.5 - 5.5 g/dL   Globulin, Total 2.4  1.5 - 4.5 g/dL   Albumin/Globulin Ratio 2.0  1.1 - 2.5   Total Bilirubin 0.7  0.0 - 1.2 mg/dL   Alkaline Phosphatase 63  39 - 117 IU/L   AST 31  0 - 40 IU/L   ALT 43  0 - 44 IU/L  NMR, LIPOPROFILE      Result Value Range   LDL Particle Number [id number] (*) <1000 nmol/L   LDLC SERPL CALC-MCNC 106 (*) <100 mg/dL   HDL Cholesterol by NMR 45  >=40 mg/dL   Triglycerides by NMR 249 (*) <150 mg/dL   Cholesterol 201 (*) <200 mg/dL   HDL Particle Number 35.0  >=30.5 umol/L   Small LDL Particle Number [id number] (*) <=527 nmol/L   LDL Size 20.0 (*) >20.5 nm   LP-IR Score 87 (*) <=45    ASSESSMENT:   Hypertension - Plan: CMP14+EGFR  GERD (gastroesophageal reflux disease)  Hyperlipidemia - Plan: CMP14+EGFR, NMR, lipoprofile, aspirin EC 81 MG tablet  Overweight  PLAN:       Dr Paula Libra Recommendations  For nutrition information, I recommend books:  1).Eat to Live by Dr Excell Seltzer. 2).Prevent and Reverse Heart Disease by Dr Karl Luke. 3) Dr Janene Harvey Book:  Program to Reverse Diabetes  Exercise recommendations are:  If unable to walk, then the patient can exercise in  a chair 3 times a day. By flapping arms like a bird gently and raising legs outwards to the front.  If ambulatory, the patient can go for walks for 30 minutes 3 times a week. Then increase the intensity and duration as tolerated.  Goal is to try to attain exercise frequency to 5 times a week.  If applicable: Best to perform resistance exercises (machines or weights) 2 days a week and cardio type exercises 3 days per week.  Discussed and handouts in  the AVS on:  DASH diet Hyperlipidemia GERD    Orders Placed This Encounter  Procedures  . CMP14+EGFR  . NMR, lipoprofile   Meds ordered this encounter  Medications  . aspirin EC 81 MG tablet    Sig: Take 1 tablet (81 mg total) by mouth daily.    Dispense:  30 tablet    Refill:  11  continue the same medications. Weight reduction Monitor BP and  Record and bring at the next visit. May need adjustment to achieve BP reading of 115/75.   There are no discontinued medications. Return in about 3 months (around 09/15/2013) for Recheck medical problems.  Arlie Riker P. Jacelyn Grip, M.D.

## 2013-06-18 LAB — NMR, LIPOPROFILE
Cholesterol: 177 mg/dL (ref <200)
HDL Cholesterol by NMR: 53 mg/dL (ref >=40)
HDL Particle Number: 37.1 umol/L (ref >=30.5)
LDL Particle Number: 1121 nmol/L — ABNORMAL HIGH (ref <1000)
LDL Size: 20.6 nm (ref >20.5)
LDLC SERPL CALC-MCNC: 91 mg/dL (ref <100)
LP-IR Score: 81 — ABNORMAL HIGH (ref <=45)
Small LDL Particle Number: 410 nmol/L (ref <=527)
Triglycerides by NMR: 166 mg/dL — ABNORMAL HIGH (ref <150)

## 2013-06-18 LAB — CMP14+EGFR
ALT: 33 IU/L (ref 0–44)
AST: 24 IU/L (ref 0–40)
Albumin/Globulin Ratio: 1.8 (ref 1.1–2.5)
Albumin: 4.5 g/dL (ref 3.5–5.5)
Alkaline Phosphatase: 56 IU/L (ref 39–117)
BUN/Creatinine Ratio: 18 (ref 9–20)
BUN: 18 mg/dL (ref 6–24)
CO2: 24 mmol/L (ref 18–29)
Calcium: 9.9 mg/dL (ref 8.7–10.2)
Chloride: 101 mmol/L (ref 97–108)
Creatinine, Ser: 0.99 mg/dL (ref 0.76–1.27)
GFR calc Af Amer: 108 mL/min/{1.73_m2} (ref >59)
GFR calc non Af Amer: 94 mL/min/{1.73_m2} (ref >59)
Globulin, Total: 2.5 g/dL (ref 1.5–4.5)
Glucose: 95 mg/dL (ref 65–99)
Potassium: 4.8 mmol/L (ref 3.5–5.2)
Sodium: 141 mmol/L (ref 134–144)
Total Bilirubin: 0.5 mg/dL (ref 0.0–1.2)
Total Protein: 7 g/dL (ref 6.0–8.5)

## 2014-05-06 ENCOUNTER — Other Ambulatory Visit: Payer: Self-pay | Admitting: Nurse Practitioner

## 2014-06-10 ENCOUNTER — Other Ambulatory Visit: Payer: Self-pay | Admitting: Family Medicine

## 2014-06-10 ENCOUNTER — Other Ambulatory Visit: Payer: Self-pay | Admitting: Nurse Practitioner

## 2014-06-12 ENCOUNTER — Other Ambulatory Visit: Payer: Self-pay | Admitting: Family

## 2014-06-12 ENCOUNTER — Other Ambulatory Visit: Payer: Self-pay | Admitting: Nurse Practitioner

## 2014-06-12 ENCOUNTER — Other Ambulatory Visit: Payer: Self-pay | Admitting: Family Medicine

## 2014-06-12 MED ORDER — LOSARTAN POTASSIUM 50 MG PO TABS: ORAL_TABLET | ORAL | Status: DC

## 2014-06-12 MED ORDER — PRAVASTATIN SODIUM 20 MG PO TABS: ORAL_TABLET | ORAL | Status: DC

## 2014-06-12 MED ORDER — AMLODIPINE BESYLATE 2.5 MG PO TABS: ORAL_TABLET | ORAL | Status: DC

## 2014-06-12 MED ORDER — ESOMEPRAZOLE MAGNESIUM 40 MG PO CPDR: DELAYED_RELEASE_CAPSULE | ORAL | Status: DC

## 2014-06-12 NOTE — Telephone Encounter (Signed)
done

## 2014-06-17 ENCOUNTER — Encounter: Payer: Self-pay | Admitting: Family

## 2014-06-17 ENCOUNTER — Ambulatory Visit (INDEPENDENT_AMBULATORY_CARE_PROVIDER_SITE_OTHER): Payer: BLUE CROSS/BLUE SHIELD | Admitting: Family

## 2014-06-17 VITALS — BP 126/86 | HR 82 | Temp 97.9°F | Ht 72.0 in | Wt 228.4 lb

## 2014-06-17 DIAGNOSIS — E785 Hyperlipidemia, unspecified: Secondary | ICD-10-CM

## 2014-06-17 DIAGNOSIS — I10 Essential (primary) hypertension: Secondary | ICD-10-CM

## 2014-06-17 DIAGNOSIS — Z125 Encounter for screening for malignant neoplasm of prostate: Secondary | ICD-10-CM

## 2014-06-17 DIAGNOSIS — Z0000 Encounter for general adult medical examination without abnormal findings: Secondary | ICD-10-CM

## 2014-06-17 DIAGNOSIS — K219 Gastro-esophageal reflux disease without esophagitis: Secondary | ICD-10-CM

## 2014-06-17 MED ORDER — PRAVASTATIN SODIUM 20 MG PO TABS: ORAL_TABLET | ORAL | Status: DC

## 2014-06-17 MED ORDER — AMLODIPINE BESYLATE 2.5 MG PO TABS: ORAL_TABLET | ORAL | Status: DC

## 2014-06-17 MED ORDER — LOSARTAN POTASSIUM 50 MG PO TABS: ORAL_TABLET | ORAL | Status: AC

## 2014-06-17 MED ORDER — ESOMEPRAZOLE MAGNESIUM 40 MG PO CPDR: DELAYED_RELEASE_CAPSULE | ORAL | Status: DC

## 2014-06-17 NOTE — Patient Instructions (Signed)

## 2014-06-17 NOTE — Progress Notes (Signed)
Subjective:    Patient ID: Dalton Cummings, male    DOB: Oct 16, 1970, 44 y.o.   MRN: [id number]  Hypertension This is a chronic problem. The current episode started more than 1 year ago. The problem has been resolved since onset. The problem is controlled. Associated symptoms include palpitations. Pertinent negatives include no anxiety, blurred vision, headaches, peripheral edema or shortness of breath. Risk factors for coronary artery disease include dyslipidemia, male gender and family history. Past treatments include calcium channel blockers. The current treatment provides significant improvement. There is no history of kidney disease, CAD/MI, heart failure or a thyroid problem. There is no history of sleep apnea.  Hyperlipidemia The current episode started more than 1 year ago. The problem is uncontrolled. Recent lipid tests were reviewed and are high. He has no history of diabetes or hypothyroidism. Factors aggravating his hyperlipidemia include fatty foods. Pertinent negatives include no leg pain, myalgias or shortness of breath. Current antihyperlipidemic treatment includes statins. The current treatment provides significant improvement of lipids. Risk factors for coronary artery disease include dyslipidemia, family history, hypertension and male sex.  Gastrophageal Reflux He reports no belching, no choking, no dysphagia, no heartburn, no sore throat or no wheezing. This is a chronic problem. The current episode started more than 1 year ago. The problem occurs rarely. The problem has been resolved. The symptoms are aggravated by certain foods. He has tried a PPI for the symptoms. The treatment provided significant relief.      Review of Systems  Constitutional: Negative.   HENT: Negative.  Negative for sore throat.   Eyes: Negative for blurred vision.  Respiratory: Negative.  Negative for choking, shortness of breath and wheezing.   Cardiovascular: Positive for palpitations.    Gastrointestinal: Negative.  Negative for heartburn and dysphagia.  Endocrine: Negative.   Genitourinary: Negative.   Musculoskeletal: Negative.  Negative for myalgias.  Neurological: Negative.  Negative for headaches.  Hematological: Negative.   Psychiatric/Behavioral: Negative.   All other systems reviewed and are negative.      Objective:   Physical Exam  Constitutional: He is oriented to person, place, and time. He appears well-developed and well-nourished. No distress.  HENT:  Head: Normocephalic.  Right Ear: External ear normal.  Left Ear: External ear normal.  Nose: Nose normal.  Mouth/Throat: Oropharynx is clear and moist.  Eyes: Pupils are equal, round, and reactive to light. Right eye exhibits no discharge. Left eye exhibits no discharge.  Neck: Normal range of motion. Neck supple. No thyromegaly present.  Cardiovascular: Normal rate, regular rhythm, normal heart sounds and intact distal pulses.   No murmur heard. Pulmonary/Chest: Effort normal and breath sounds normal. No respiratory distress. He has no wheezes.  Abdominal: Soft. Bowel sounds are normal. He exhibits no distension. There is no tenderness.  Musculoskeletal: Normal range of motion. He exhibits no edema or tenderness.  Neurological: He is alert and oriented to person, place, and time. He has normal reflexes. No cranial nerve deficit.  Skin: Skin is warm and dry. No rash noted. No erythema.  Psychiatric: He has a normal mood and affect. His behavior is normal. Judgment and thought content normal.  Vitals reviewed.   BP 126/86 mmHg  Pulse 82  Temp(Src) 97.9 F (36.6 C) (Oral)  Ht 6' (1.829 m)  Wt 228 lb 6.4 oz (103.602 kg)  BMI 30.97 kg/m2       Assessment & Plan:  1. Essential hypertension - amLODipine (NORVASC) 2.5 MG tablet; TAKE ONE (1) TABLET EACH DAY  Dispense: 90 tablet; Refill: 4 - losartan (COZAAR) 50 MG tablet; TAKE ONE (1) TABLET EACH DAY  Dispense: 90 tablet; Refill: 4 - CMP14+EGFR;  Future  2. Gastroesophageal reflux disease, esophagitis presence not specified * - esomeprazole (NEXIUM) 40 MG capsule; TAKE ONE CAPSULE EACH MORNING  Dispense: 90 capsule; Refill: 4 - CMP14+EGFR; Future  3. Hyperlipidemia - pravastatin (PRAVACHOL) 20 MG tablet; TAKE ONE (1) TABLET EACH DAY  Dispense: 90 tablet; Refill: 4 - CMP14+EGFR; Future  4. Annual physical exam - CMP14+EGFR; Future - Lipid panel; Future - Vit D  25 hydroxy (rtn osteoporosis monitoring); Future  5. Prostate cancer screening - CMP14+EGFR; Future - PSA, total and free; Future   Continue all meds Labs ordered- Pt to get blood work drawn in next week or so Health Maintenance reviewed Diet and exercise encouraged RTO 1 year  Evelina Dun, FNP

## 2014-06-24 ENCOUNTER — Telehealth: Payer: Self-pay | Admitting: *Deleted

## 2014-06-24 NOTE — Telephone Encounter (Signed)
Christy, I did a PA on Lee's esmeprazole

## 2014-07-18 ENCOUNTER — Other Ambulatory Visit: Payer: Self-pay | Admitting: Family

## 2014-12-04 ENCOUNTER — Other Ambulatory Visit: Payer: Self-pay | Admitting: Family

## 2015-01-16 ENCOUNTER — Other Ambulatory Visit: Payer: Self-pay | Admitting: Family

## 2015-03-10 ENCOUNTER — Other Ambulatory Visit: Payer: Self-pay | Admitting: Family

## 2015-03-10 NOTE — Telephone Encounter (Signed)
Last seen 06/18/14

## 2015-07-17 ENCOUNTER — Other Ambulatory Visit: Payer: Self-pay | Admitting: Family Medicine

## 2015-09-16 DIAGNOSIS — I10 Essential (primary) hypertension: Secondary | ICD-10-CM | POA: Diagnosis not present

## 2015-09-16 DIAGNOSIS — Z6831 Body mass index (BMI) 31.0-31.9, adult: Secondary | ICD-10-CM | POA: Diagnosis not present

## 2015-10-07 ENCOUNTER — Other Ambulatory Visit: Payer: Self-pay | Admitting: Family

## 2015-10-21 DIAGNOSIS — I10 Essential (primary) hypertension: Secondary | ICD-10-CM | POA: Diagnosis not present

## 2015-10-21 DIAGNOSIS — Z6830 Body mass index (BMI) 30.0-30.9, adult: Secondary | ICD-10-CM | POA: Diagnosis not present

## 2015-11-16 ENCOUNTER — Other Ambulatory Visit: Payer: Self-pay | Admitting: Family

## 2016-01-04 ENCOUNTER — Other Ambulatory Visit: Payer: Self-pay | Admitting: Family

## 2016-01-04 DIAGNOSIS — E785 Hyperlipidemia, unspecified: Secondary | ICD-10-CM

## 2016-01-27 DIAGNOSIS — Z6831 Body mass index (BMI) 31.0-31.9, adult: Secondary | ICD-10-CM | POA: Diagnosis not present

## 2016-01-27 DIAGNOSIS — I10 Essential (primary) hypertension: Secondary | ICD-10-CM | POA: Diagnosis not present

## 2016-02-12 ENCOUNTER — Other Ambulatory Visit: Payer: Self-pay | Admitting: Family

## 2016-03-21 ENCOUNTER — Other Ambulatory Visit: Payer: Self-pay | Admitting: Family

## 2016-06-10 DIAGNOSIS — R05 Cough: Secondary | ICD-10-CM | POA: Diagnosis not present

## 2016-06-10 DIAGNOSIS — I10 Essential (primary) hypertension: Secondary | ICD-10-CM | POA: Diagnosis not present

## 2016-06-10 DIAGNOSIS — Z6831 Body mass index (BMI) 31.0-31.9, adult: Secondary | ICD-10-CM | POA: Diagnosis not present

## 2016-08-18 DIAGNOSIS — R740 Nonspecific elevation of levels of transaminase and lactic acid dehydrogenase [LDH]: Secondary | ICD-10-CM | POA: Diagnosis not present

## 2016-08-18 DIAGNOSIS — I10 Essential (primary) hypertension: Secondary | ICD-10-CM | POA: Diagnosis not present

## 2016-11-09 DIAGNOSIS — K219 Gastro-esophageal reflux disease without esophagitis: Secondary | ICD-10-CM | POA: Diagnosis not present

## 2016-11-09 DIAGNOSIS — I10 Essential (primary) hypertension: Secondary | ICD-10-CM | POA: Diagnosis not present

## 2016-11-09 DIAGNOSIS — E782 Mixed hyperlipidemia: Secondary | ICD-10-CM | POA: Diagnosis not present

## 2016-11-09 DIAGNOSIS — Z6831 Body mass index (BMI) 31.0-31.9, adult: Secondary | ICD-10-CM | POA: Diagnosis not present

## 2017-03-07 DIAGNOSIS — Z6831 Body mass index (BMI) 31.0-31.9, adult: Secondary | ICD-10-CM | POA: Diagnosis not present

## 2017-03-07 DIAGNOSIS — K219 Gastro-esophageal reflux disease without esophagitis: Secondary | ICD-10-CM | POA: Diagnosis not present

## 2017-03-07 DIAGNOSIS — I10 Essential (primary) hypertension: Secondary | ICD-10-CM | POA: Diagnosis not present

## 2017-03-07 DIAGNOSIS — Z23 Encounter for immunization: Secondary | ICD-10-CM | POA: Diagnosis not present

## 2017-03-07 DIAGNOSIS — E782 Mixed hyperlipidemia: Secondary | ICD-10-CM | POA: Diagnosis not present

## 2017-04-04 DIAGNOSIS — D485 Neoplasm of uncertain behavior of skin: Secondary | ICD-10-CM | POA: Diagnosis not present

## 2017-04-04 DIAGNOSIS — L821 Other seborrheic keratosis: Secondary | ICD-10-CM | POA: Diagnosis not present

## 2017-04-04 DIAGNOSIS — L570 Actinic keratosis: Secondary | ICD-10-CM | POA: Diagnosis not present

## 2017-08-16 DIAGNOSIS — I10 Essential (primary) hypertension: Secondary | ICD-10-CM | POA: Diagnosis not present

## 2017-08-16 DIAGNOSIS — E782 Mixed hyperlipidemia: Secondary | ICD-10-CM | POA: Diagnosis not present

## 2017-08-16 DIAGNOSIS — R05 Cough: Secondary | ICD-10-CM | POA: Diagnosis not present

## 2017-08-16 DIAGNOSIS — R079 Chest pain, unspecified: Secondary | ICD-10-CM | POA: Diagnosis not present

## 2017-08-29 DIAGNOSIS — R079 Chest pain, unspecified: Secondary | ICD-10-CM | POA: Diagnosis not present

## 2017-09-05 DIAGNOSIS — I10 Essential (primary) hypertension: Secondary | ICD-10-CM | POA: Diagnosis not present

## 2017-09-05 DIAGNOSIS — R079 Chest pain, unspecified: Secondary | ICD-10-CM | POA: Diagnosis not present

## 2017-09-05 DIAGNOSIS — R05 Cough: Secondary | ICD-10-CM | POA: Diagnosis not present

## 2017-09-05 DIAGNOSIS — E782 Mixed hyperlipidemia: Secondary | ICD-10-CM | POA: Diagnosis not present

## 2017-09-27 DIAGNOSIS — K219 Gastro-esophageal reflux disease without esophagitis: Secondary | ICD-10-CM | POA: Diagnosis not present

## 2017-09-27 DIAGNOSIS — E782 Mixed hyperlipidemia: Secondary | ICD-10-CM | POA: Diagnosis not present

## 2017-09-27 DIAGNOSIS — Z6831 Body mass index (BMI) 31.0-31.9, adult: Secondary | ICD-10-CM | POA: Diagnosis not present

## 2017-09-27 DIAGNOSIS — I10 Essential (primary) hypertension: Secondary | ICD-10-CM | POA: Diagnosis not present

## 2018-03-28 DIAGNOSIS — Z23 Encounter for immunization: Secondary | ICD-10-CM | POA: Diagnosis not present

## 2018-04-04 DIAGNOSIS — L570 Actinic keratosis: Secondary | ICD-10-CM | POA: Diagnosis not present

## 2018-04-04 DIAGNOSIS — D1801 Hemangioma of skin and subcutaneous tissue: Secondary | ICD-10-CM | POA: Diagnosis not present

## 2018-04-04 DIAGNOSIS — L819 Disorder of pigmentation, unspecified: Secondary | ICD-10-CM | POA: Diagnosis not present

## 2018-06-26 DIAGNOSIS — Z6833 Body mass index (BMI) 33.0-33.9, adult: Secondary | ICD-10-CM | POA: Diagnosis not present

## 2018-06-26 DIAGNOSIS — J069 Acute upper respiratory infection, unspecified: Secondary | ICD-10-CM | POA: Diagnosis not present

## 2018-09-03 DIAGNOSIS — M9903 Segmental and somatic dysfunction of lumbar region: Secondary | ICD-10-CM | POA: Diagnosis not present

## 2018-09-03 DIAGNOSIS — M9901 Segmental and somatic dysfunction of cervical region: Secondary | ICD-10-CM | POA: Diagnosis not present

## 2018-09-04 DIAGNOSIS — M9903 Segmental and somatic dysfunction of lumbar region: Secondary | ICD-10-CM | POA: Diagnosis not present

## 2018-09-04 DIAGNOSIS — M9901 Segmental and somatic dysfunction of cervical region: Secondary | ICD-10-CM | POA: Diagnosis not present

## 2018-09-06 DIAGNOSIS — M9901 Segmental and somatic dysfunction of cervical region: Secondary | ICD-10-CM | POA: Diagnosis not present

## 2018-09-06 DIAGNOSIS — M9903 Segmental and somatic dysfunction of lumbar region: Secondary | ICD-10-CM | POA: Diagnosis not present

## 2018-09-10 DIAGNOSIS — M9903 Segmental and somatic dysfunction of lumbar region: Secondary | ICD-10-CM | POA: Diagnosis not present

## 2018-09-10 DIAGNOSIS — M9901 Segmental and somatic dysfunction of cervical region: Secondary | ICD-10-CM | POA: Diagnosis not present

## 2018-09-12 DIAGNOSIS — M9901 Segmental and somatic dysfunction of cervical region: Secondary | ICD-10-CM | POA: Diagnosis not present

## 2018-09-12 DIAGNOSIS — M9903 Segmental and somatic dysfunction of lumbar region: Secondary | ICD-10-CM | POA: Diagnosis not present

## 2018-09-17 DIAGNOSIS — M9903 Segmental and somatic dysfunction of lumbar region: Secondary | ICD-10-CM | POA: Diagnosis not present

## 2018-09-17 DIAGNOSIS — M9901 Segmental and somatic dysfunction of cervical region: Secondary | ICD-10-CM | POA: Diagnosis not present

## 2018-09-19 DIAGNOSIS — M9903 Segmental and somatic dysfunction of lumbar region: Secondary | ICD-10-CM | POA: Diagnosis not present

## 2018-09-19 DIAGNOSIS — M9901 Segmental and somatic dysfunction of cervical region: Secondary | ICD-10-CM | POA: Diagnosis not present

## 2018-09-20 DIAGNOSIS — M9901 Segmental and somatic dysfunction of cervical region: Secondary | ICD-10-CM | POA: Diagnosis not present

## 2018-09-20 DIAGNOSIS — M9903 Segmental and somatic dysfunction of lumbar region: Secondary | ICD-10-CM | POA: Diagnosis not present

## 2018-09-24 DIAGNOSIS — M9903 Segmental and somatic dysfunction of lumbar region: Secondary | ICD-10-CM | POA: Diagnosis not present

## 2018-09-24 DIAGNOSIS — M9901 Segmental and somatic dysfunction of cervical region: Secondary | ICD-10-CM | POA: Diagnosis not present

## 2018-09-27 DIAGNOSIS — M9901 Segmental and somatic dysfunction of cervical region: Secondary | ICD-10-CM | POA: Diagnosis not present

## 2018-09-27 DIAGNOSIS — M9903 Segmental and somatic dysfunction of lumbar region: Secondary | ICD-10-CM | POA: Diagnosis not present

## 2018-10-01 DIAGNOSIS — M9903 Segmental and somatic dysfunction of lumbar region: Secondary | ICD-10-CM | POA: Diagnosis not present

## 2018-10-01 DIAGNOSIS — M9901 Segmental and somatic dysfunction of cervical region: Secondary | ICD-10-CM | POA: Diagnosis not present

## 2018-10-03 DIAGNOSIS — M9901 Segmental and somatic dysfunction of cervical region: Secondary | ICD-10-CM | POA: Diagnosis not present

## 2018-10-03 DIAGNOSIS — M9903 Segmental and somatic dysfunction of lumbar region: Secondary | ICD-10-CM | POA: Diagnosis not present

## 2018-10-08 DIAGNOSIS — M9903 Segmental and somatic dysfunction of lumbar region: Secondary | ICD-10-CM | POA: Diagnosis not present

## 2018-10-08 DIAGNOSIS — M9901 Segmental and somatic dysfunction of cervical region: Secondary | ICD-10-CM | POA: Diagnosis not present

## 2018-10-10 DIAGNOSIS — M9903 Segmental and somatic dysfunction of lumbar region: Secondary | ICD-10-CM | POA: Diagnosis not present

## 2018-10-10 DIAGNOSIS — M9901 Segmental and somatic dysfunction of cervical region: Secondary | ICD-10-CM | POA: Diagnosis not present

## 2018-10-15 DIAGNOSIS — M9901 Segmental and somatic dysfunction of cervical region: Secondary | ICD-10-CM | POA: Diagnosis not present

## 2018-10-15 DIAGNOSIS — M9903 Segmental and somatic dysfunction of lumbar region: Secondary | ICD-10-CM | POA: Diagnosis not present

## 2018-10-17 DIAGNOSIS — M9903 Segmental and somatic dysfunction of lumbar region: Secondary | ICD-10-CM | POA: Diagnosis not present

## 2018-10-17 DIAGNOSIS — M9901 Segmental and somatic dysfunction of cervical region: Secondary | ICD-10-CM | POA: Diagnosis not present

## 2018-10-24 DIAGNOSIS — M9901 Segmental and somatic dysfunction of cervical region: Secondary | ICD-10-CM | POA: Diagnosis not present

## 2018-10-24 DIAGNOSIS — M9903 Segmental and somatic dysfunction of lumbar region: Secondary | ICD-10-CM | POA: Diagnosis not present

## 2018-10-29 DIAGNOSIS — M9901 Segmental and somatic dysfunction of cervical region: Secondary | ICD-10-CM | POA: Diagnosis not present

## 2018-10-29 DIAGNOSIS — M9903 Segmental and somatic dysfunction of lumbar region: Secondary | ICD-10-CM | POA: Diagnosis not present

## 2018-10-31 DIAGNOSIS — M9901 Segmental and somatic dysfunction of cervical region: Secondary | ICD-10-CM | POA: Diagnosis not present

## 2018-10-31 DIAGNOSIS — M9903 Segmental and somatic dysfunction of lumbar region: Secondary | ICD-10-CM | POA: Diagnosis not present

## 2018-11-05 DIAGNOSIS — M9901 Segmental and somatic dysfunction of cervical region: Secondary | ICD-10-CM | POA: Diagnosis not present

## 2018-11-05 DIAGNOSIS — M9903 Segmental and somatic dysfunction of lumbar region: Secondary | ICD-10-CM | POA: Diagnosis not present

## 2018-11-16 DIAGNOSIS — M25562 Pain in left knee: Secondary | ICD-10-CM | POA: Diagnosis not present

## 2018-11-21 DIAGNOSIS — S83242A Other tear of medial meniscus, current injury, left knee, initial encounter: Secondary | ICD-10-CM | POA: Diagnosis not present

## 2018-11-21 DIAGNOSIS — Z135 Encounter for screening for eye and ear disorders: Secondary | ICD-10-CM | POA: Diagnosis not present

## 2018-11-21 DIAGNOSIS — X58XXXA Exposure to other specified factors, initial encounter: Secondary | ICD-10-CM | POA: Diagnosis not present

## 2018-11-21 DIAGNOSIS — M9901 Segmental and somatic dysfunction of cervical region: Secondary | ICD-10-CM | POA: Diagnosis not present

## 2018-11-21 DIAGNOSIS — M25562 Pain in left knee: Secondary | ICD-10-CM | POA: Diagnosis not present

## 2018-11-21 DIAGNOSIS — M9903 Segmental and somatic dysfunction of lumbar region: Secondary | ICD-10-CM | POA: Diagnosis not present

## 2018-11-22 DIAGNOSIS — M25562 Pain in left knee: Secondary | ICD-10-CM | POA: Diagnosis not present

## 2018-11-29 DIAGNOSIS — I10 Essential (primary) hypertension: Secondary | ICD-10-CM | POA: Diagnosis not present

## 2018-11-29 DIAGNOSIS — K219 Gastro-esophageal reflux disease without esophagitis: Secondary | ICD-10-CM | POA: Diagnosis not present

## 2018-11-29 DIAGNOSIS — Z6831 Body mass index (BMI) 31.0-31.9, adult: Secondary | ICD-10-CM | POA: Diagnosis not present

## 2018-11-29 DIAGNOSIS — Z1389 Encounter for screening for other disorder: Secondary | ICD-10-CM | POA: Diagnosis not present

## 2018-11-29 DIAGNOSIS — E782 Mixed hyperlipidemia: Secondary | ICD-10-CM | POA: Diagnosis not present

## 2018-12-12 DIAGNOSIS — M9903 Segmental and somatic dysfunction of lumbar region: Secondary | ICD-10-CM | POA: Diagnosis not present

## 2018-12-12 DIAGNOSIS — M9901 Segmental and somatic dysfunction of cervical region: Secondary | ICD-10-CM | POA: Diagnosis not present

## 2018-12-19 DIAGNOSIS — S83282A Other tear of lateral meniscus, current injury, left knee, initial encounter: Secondary | ICD-10-CM | POA: Diagnosis not present

## 2018-12-19 DIAGNOSIS — M6752 Plica syndrome, left knee: Secondary | ICD-10-CM | POA: Diagnosis not present

## 2018-12-19 DIAGNOSIS — S83232A Complex tear of medial meniscus, current injury, left knee, initial encounter: Secondary | ICD-10-CM | POA: Diagnosis not present

## 2018-12-19 DIAGNOSIS — S83242A Other tear of medial meniscus, current injury, left knee, initial encounter: Secondary | ICD-10-CM | POA: Diagnosis not present

## 2018-12-19 DIAGNOSIS — S83272S Complex tear of lateral meniscus, current injury, left knee, sequela: Secondary | ICD-10-CM | POA: Diagnosis not present

## 2018-12-19 DIAGNOSIS — M1712 Unilateral primary osteoarthritis, left knee: Secondary | ICD-10-CM | POA: Diagnosis not present

## 2018-12-19 DIAGNOSIS — Y999 Unspecified external cause status: Secondary | ICD-10-CM | POA: Diagnosis not present

## 2018-12-19 DIAGNOSIS — X58XXXA Exposure to other specified factors, initial encounter: Secondary | ICD-10-CM | POA: Diagnosis not present

## 2018-12-24 DIAGNOSIS — M9901 Segmental and somatic dysfunction of cervical region: Secondary | ICD-10-CM | POA: Diagnosis not present

## 2018-12-24 DIAGNOSIS — M25562 Pain in left knee: Secondary | ICD-10-CM | POA: Diagnosis not present

## 2018-12-24 DIAGNOSIS — M9903 Segmental and somatic dysfunction of lumbar region: Secondary | ICD-10-CM | POA: Diagnosis not present

## 2018-12-28 DIAGNOSIS — M25562 Pain in left knee: Secondary | ICD-10-CM | POA: Diagnosis not present

## 2019-01-02 DIAGNOSIS — T2122XA Burn of second degree of abdominal wall, initial encounter: Secondary | ICD-10-CM | POA: Diagnosis not present

## 2019-01-02 DIAGNOSIS — T24212A Burn of second degree of left thigh, initial encounter: Secondary | ICD-10-CM | POA: Diagnosis not present

## 2019-01-02 DIAGNOSIS — Z6831 Body mass index (BMI) 31.0-31.9, adult: Secondary | ICD-10-CM | POA: Diagnosis not present

## 2019-01-25 DIAGNOSIS — M25562 Pain in left knee: Secondary | ICD-10-CM | POA: Diagnosis not present

## 2019-03-14 DIAGNOSIS — Z23 Encounter for immunization: Secondary | ICD-10-CM | POA: Diagnosis not present

## 2019-04-08 DIAGNOSIS — L570 Actinic keratosis: Secondary | ICD-10-CM | POA: Diagnosis not present

## 2019-05-03 DIAGNOSIS — M25512 Pain in left shoulder: Secondary | ICD-10-CM | POA: Diagnosis not present

## 2019-05-03 DIAGNOSIS — M25562 Pain in left knee: Secondary | ICD-10-CM | POA: Diagnosis not present

## 2019-05-06 DIAGNOSIS — E782 Mixed hyperlipidemia: Secondary | ICD-10-CM | POA: Diagnosis not present

## 2019-05-06 DIAGNOSIS — I10 Essential (primary) hypertension: Secondary | ICD-10-CM | POA: Diagnosis not present

## 2019-05-06 DIAGNOSIS — K219 Gastro-esophageal reflux disease without esophagitis: Secondary | ICD-10-CM | POA: Diagnosis not present

## 2019-05-31 DIAGNOSIS — J1282 Pneumonia due to coronavirus disease 2019: Secondary | ICD-10-CM

## 2019-05-31 DIAGNOSIS — U071 COVID-19: Secondary | ICD-10-CM

## 2019-05-31 HISTORY — DX: Pneumonia due to coronavirus disease 2019: J12.82

## 2019-05-31 HISTORY — DX: COVID-19: U07.1

## 2019-06-03 ENCOUNTER — Ambulatory Visit: Payer: BC Managed Care – PPO | Attending: Internal Medicine

## 2019-06-03 ENCOUNTER — Other Ambulatory Visit: Payer: Self-pay

## 2019-06-03 DIAGNOSIS — Z20822 Contact with and (suspected) exposure to covid-19: Secondary | ICD-10-CM

## 2019-06-05 LAB — NOVEL CORONAVIRUS, NAA: SARS-CoV-2, NAA: DETECTED — AB

## 2019-06-10 ENCOUNTER — Encounter (HOSPITAL_COMMUNITY): Payer: Self-pay | Admitting: *Deleted

## 2019-06-10 ENCOUNTER — Emergency Department (HOSPITAL_COMMUNITY)
Admission: EM | Admit: 2019-06-10 | Discharge: 2019-06-10 | Disposition: A | Discharge status: Home / Self Care | Payer: BC Managed Care – PPO | Attending: Emergency Medicine | Admitting: Emergency Medicine

## 2019-06-10 ENCOUNTER — Other Ambulatory Visit: Payer: Self-pay

## 2019-06-10 ENCOUNTER — Emergency Department (HOSPITAL_COMMUNITY): Payer: BC Managed Care – PPO

## 2019-06-10 DIAGNOSIS — R5383 Other fatigue: Secondary | ICD-10-CM | POA: Diagnosis not present

## 2019-06-10 DIAGNOSIS — R0602 Shortness of breath: Secondary | ICD-10-CM | POA: Diagnosis not present

## 2019-06-10 DIAGNOSIS — U071 COVID-19: Secondary | ICD-10-CM | POA: Diagnosis not present

## 2019-06-10 DIAGNOSIS — Z7982 Long term (current) use of aspirin: Secondary | ICD-10-CM | POA: Diagnosis not present

## 2019-06-10 DIAGNOSIS — R531 Weakness: Secondary | ICD-10-CM | POA: Diagnosis not present

## 2019-06-10 DIAGNOSIS — I10 Essential (primary) hypertension: Secondary | ICD-10-CM | POA: Insufficient documentation

## 2019-06-10 DIAGNOSIS — Z79899 Other long term (current) drug therapy: Secondary | ICD-10-CM | POA: Diagnosis not present

## 2019-06-10 LAB — CBC WITH DIFFERENTIAL/PLATELET
Abs Immature Granulocytes: 0.03 10*3/uL (ref 0.00–0.07)
Basophils Absolute: 0 10*3/uL (ref 0.0–0.1)
Basophils Relative: 0 %
Eosinophils Absolute: 0 10*3/uL (ref 0.0–0.5)
Eosinophils Relative: 0 %
HCT: 45.8 % (ref 39.0–52.0)
Hemoglobin: 15.2 g/dL (ref 13.0–17.0)
Immature Granulocytes: 1 %
Lymphocytes Relative: 23 %
Lymphs Abs: 1.3 10*3/uL (ref 0.7–4.0)
MCH: 31.1 pg (ref 26.0–34.0)
MCHC: 33.2 g/dL (ref 30.0–36.0)
MCV: 93.9 fL (ref 80.0–100.0)
Monocytes Absolute: 0.5 10*3/uL (ref 0.1–1.0)
Monocytes Relative: 10 %
Neutro Abs: 3.8 10*3/uL (ref 1.7–7.7)
Neutrophils Relative %: 66 %
Platelets: 219 10*3/uL (ref 150–400)
RBC: 4.88 MIL/uL (ref 4.22–5.81)
RDW: 12.1 % (ref 11.5–15.5)
WBC: 5.6 10*3/uL (ref 4.0–10.5)
nRBC: 0 % (ref 0.0–0.2)

## 2019-06-10 LAB — BASIC METABOLIC PANEL
Anion gap: 10 (ref 5–15)
BUN: 19 mg/dL (ref 6–20)
CO2: 27 mmol/L (ref 22–32)
Calcium: 8.8 mg/dL — ABNORMAL LOW (ref 8.9–10.3)
Chloride: 102 mmol/L (ref 98–111)
Creatinine, Ser: 1.01 mg/dL (ref 0.61–1.24)
GFR calc Af Amer: >60 mL/min (ref >60)
GFR calc non Af Amer: >60 mL/min (ref >60)
Glucose, Bld: 104 mg/dL — ABNORMAL HIGH (ref 70–99)
Potassium: 4 mmol/L (ref 3.5–5.1)
Sodium: 139 mmol/L (ref 135–145)

## 2019-06-10 MED ORDER — KETOROLAC TROMETHAMINE 30 MG/ML IJ SOLN
30.0000 mg | Freq: Once | INTRAMUSCULAR | Status: AC
  Administered 2019-06-10: 30 mg via INTRAVENOUS
  Filled 2019-06-10: qty 1

## 2019-06-10 MED ORDER — SODIUM CHLORIDE 0.9 % IV BOLUS
1000.0000 mL | Freq: Once | INTRAVENOUS | Status: AC
  Administered 2019-06-10: 1000 mL via INTRAVENOUS

## 2019-06-10 MED ORDER — ALBUTEROL SULFATE HFA 108 (90 BASE) MCG/ACT IN AERS: 2.0000 | INHALATION_SPRAY | RESPIRATORY_TRACT | 0 refills | Status: DC | PRN

## 2019-06-10 MED ORDER — ALBUTEROL SULFATE HFA 108 (90 BASE) MCG/ACT IN AERS
2.0000 | INHALATION_SPRAY | RESPIRATORY_TRACT | Status: DC | PRN
  Administered 2019-06-10: 2 via RESPIRATORY_TRACT
  Filled 2019-06-10: qty 6.7

## 2019-06-10 NOTE — Discharge Instructions (Addendum)
Drink plenty of fluids, Tylenol for pain or Motrin.  Follow-up with your doctor if any problem.  Use the albuterol inhaler for cough and shortness of breath

## 2019-06-10 NOTE — ED Triage Notes (Signed)
Pt c/o nausea, vomiting, fatigue, leg pain, back pain and chills that started 8 days ago. Pt tested positive for Covid 8 days ago as well. Ibuprofen last taken at 1530 today.

## 2019-06-10 NOTE — ED Provider Notes (Signed)
[facility] EMERGENCY DEPARTMENT Provider Note   CSN: [id number] Arrival date & time: 06/10/19  1727     History Chief Complaint  Patient presents with  . Fatigue    Covid Positive    Dalton Cummings is a 49 y.o. male.  Patient complains of fever aches and cough weakness for over a week.  He was tested positive for Covid 8 days ago  The history is provided by the patient. No language interpreter was used.  Weakness Severity:  Moderate Onset quality:  Gradual Timing:  Constant Progression:  Worsening Chronicity:  New Context: not alcohol use   Relieved by:  Nothing Worsened by:  Nothing Ineffective treatments:  None tried Associated symptoms: no abdominal pain, no chest pain, no cough, no diarrhea, no frequency, no headaches and no seizures        Past Medical History:  Diagnosis Date  . GERD (gastroesophageal reflux disease)   . Hyperlipidemia   . Hypertension     Patient Active Problem List   Diagnosis Date Noted  . Hypertension   . GERD (gastroesophageal reflux disease)   . Hyperlipidemia     Past Surgical History:  Procedure Laterality Date  . APPENDECTOMY    . left elbow surgery    . left rotator cuff    . right ankle  repair         Family History  Problem Relation Age of Onset  . Diabetes Father   . Heart disease Father     Social History   Tobacco Use  . Smoking status: Never Smoker  . Smokeless tobacco: Never Used  Substance Use Topics  . Alcohol use: No  . Drug use: No    Home Medications Prior to Admission medications   Medication Sig Start Date End Date Taking? Authorizing Provider  amLODipine (NORVASC) 2.5 MG tablet TAKE ONE (1) TABLET EACH DAY 06/17/14   Evelina Dun A, FNP  aspirin EC 81 MG tablet Take 1 tablet (81 mg total) by mouth daily. 06/17/13   Vernie Shanks, MD  esomeprazole (NEXIUM) 40 MG capsule TAKE ONE CAPSULE EACH MORNING 06/17/14   Evelina Dun A, FNP  losartan (COZAAR) 50 MG tablet TAKE ONE (1) TABLET  EACH DAY 06/17/14   Hawks, Christy A, FNP  MOBIC 15 MG tablet Take 15 mg by mouth daily. 05/03/19   [provider]  omeprazole (PRILOSEC) 40 MG capsule TAKE ONE (1) CAPSULE EACH DAY Patient taking differently: Take 40 mg by mouth daily.  02/12/16   Sharion Balloon, FNP  pravastatin (PRAVACHOL) 20 MG tablet TAKE ONE (1) TABLET EACH DAY 06/17/14   Evelina Dun A, FNP    Allergies    Codeine and Penicillins  Review of Systems   Review of Systems  Constitutional: Negative for appetite change and fatigue.  HENT: Negative for congestion, ear discharge and sinus pressure.   Eyes: Negative for discharge.  Respiratory: Negative for cough.   Cardiovascular: Negative for chest pain.  Gastrointestinal: Negative for abdominal pain and diarrhea.  Genitourinary: Negative for frequency and hematuria.  Musculoskeletal: Negative for back pain.  Skin: Negative for rash.  Neurological: Positive for weakness. Negative for seizures and headaches.  Psychiatric/Behavioral: Negative for hallucinations.    Physical Exam Updated Vital Signs BP 123/79   Pulse 92   Temp 98.8 F (37.1 C) (Oral)   Resp 15   Ht 6' (1.829 m)   Wt 106.1 kg   SpO2 93%   BMI 31.74 kg/m   Physical Exam  Vitals and nursing note reviewed.  Constitutional:      Appearance: He is well-developed.  HENT:     Head: Normocephalic.     Nose: Nose normal.  Eyes:     General: No scleral icterus.    Conjunctiva/sclera: Conjunctivae normal.  Neck:     Thyroid: No thyromegaly.  Cardiovascular:     Rate and Rhythm: Normal rate and regular rhythm.     Heart sounds: No murmur. No friction rub. No gallop.   Pulmonary:     Breath sounds: No stridor. No wheezing or rales.  Chest:     Chest wall: No tenderness.  Abdominal:     General: There is no distension.     Tenderness: There is no abdominal tenderness. There is no rebound.  Musculoskeletal:        General: Normal range of motion.     Cervical back: Neck supple.    Lymphadenopathy:     Cervical: No cervical adenopathy.  Skin:    Findings: No erythema or rash.  Neurological:     Mental Status: He is alert and oriented to person, place, and time.     Motor: No abnormal muscle tone.     Coordination: Coordination normal.  Psychiatric:        Behavior: Behavior normal.     ED Results / Procedures / Treatments   Labs (all labs ordered are listed, but only abnormal results are displayed) Labs Reviewed  CBC WITH DIFFERENTIAL/PLATELET  BASIC METABOLIC PANEL    EKG None  Radiology DG Chest Portable 1 View  Result Date: 06/10/2019 CLINICAL DATA:  Shortness of breath EXAM: PORTABLE CHEST 1 VIEW COMPARISON:  None. FINDINGS: Patchy bilateral airspace opacities compatible with pneumonia. Heart is normal size. No effusions. No acute bony abnormality. IMPRESSION: Patchy bilateral airspace opacities compatible with pneumonia. Electronically Signed   By: Charlett Nose M.D.   On: 06/10/2019 22:40    Procedures Procedures (including critical care time)  Medications Ordered in ED Medications  albuterol (VENTOLIN HFA) 108 (90 Base) MCG/ACT inhaler 2 puff (2 puffs Inhalation Given 06/10/19 2244)  sodium chloride 0.9 % bolus 1,000 mL (1,000 mLs Intravenous New Bag/Given 06/10/19 2246)  ketorolac (TORADOL) 30 MG/ML injection 30 mg (30 mg Intravenous Given 06/10/19 2244)    ED Course  I have reviewed the triage vital signs and the nursing notes.  Pertinent labs & imaging results that were available during my care of the patient were reviewed by me and considered in my medical decision making (see chart for details).    MDM Rules/Calculators/A&P                      Patient with COVID-19.  Patient nontoxic and will be discharged home with albuterol and will follow up with PCP Final Clinical Impression(s) / ED Diagnoses Final diagnoses:  None    Rx / DC Orders ED Discharge Orders    None       Bethann Berkshire, MD 06/10/19 2325

## 2019-06-12 DIAGNOSIS — R7989 Other specified abnormal findings of blood chemistry: Secondary | ICD-10-CM | POA: Diagnosis not present

## 2019-06-12 DIAGNOSIS — R531 Weakness: Secondary | ICD-10-CM | POA: Diagnosis not present

## 2019-06-12 DIAGNOSIS — U071 COVID-19: Secondary | ICD-10-CM | POA: Diagnosis not present

## 2019-06-12 DIAGNOSIS — Z880 Allergy status to penicillin: Secondary | ICD-10-CM | POA: Diagnosis not present

## 2019-06-12 DIAGNOSIS — J189 Pneumonia, unspecified organism: Secondary | ICD-10-CM | POA: Diagnosis not present

## 2019-06-12 DIAGNOSIS — R791 Abnormal coagulation profile: Secondary | ICD-10-CM | POA: Diagnosis not present

## 2019-06-12 DIAGNOSIS — Z8249 Family history of ischemic heart disease and other diseases of the circulatory system: Secondary | ICD-10-CM | POA: Diagnosis not present

## 2019-06-12 DIAGNOSIS — K219 Gastro-esophageal reflux disease without esophagitis: Secondary | ICD-10-CM | POA: Diagnosis not present

## 2019-06-12 DIAGNOSIS — J1282 Pneumonia due to coronavirus disease 2019: Secondary | ICD-10-CM | POA: Diagnosis not present

## 2019-06-12 DIAGNOSIS — E7800 Pure hypercholesterolemia, unspecified: Secondary | ICD-10-CM | POA: Diagnosis not present

## 2019-06-12 DIAGNOSIS — R0902 Hypoxemia: Secondary | ICD-10-CM | POA: Diagnosis not present

## 2019-06-12 DIAGNOSIS — I10 Essential (primary) hypertension: Secondary | ICD-10-CM | POA: Diagnosis not present

## 2019-06-25 DIAGNOSIS — J1282 Pneumonia due to coronavirus disease 2019: Secondary | ICD-10-CM | POA: Diagnosis not present

## 2019-06-25 DIAGNOSIS — U071 COVID-19: Secondary | ICD-10-CM | POA: Diagnosis not present

## 2019-06-25 DIAGNOSIS — I10 Essential (primary) hypertension: Secondary | ICD-10-CM | POA: Diagnosis not present

## 2019-06-25 DIAGNOSIS — E782 Mixed hyperlipidemia: Secondary | ICD-10-CM | POA: Diagnosis not present

## 2019-07-03 DIAGNOSIS — U071 COVID-19: Secondary | ICD-10-CM | POA: Diagnosis not present

## 2019-07-03 DIAGNOSIS — R0600 Dyspnea, unspecified: Secondary | ICD-10-CM | POA: Diagnosis not present

## 2019-07-03 DIAGNOSIS — R609 Edema, unspecified: Secondary | ICD-10-CM | POA: Diagnosis not present

## 2019-07-03 DIAGNOSIS — E782 Mixed hyperlipidemia: Secondary | ICD-10-CM | POA: Diagnosis not present

## 2019-07-03 DIAGNOSIS — M79605 Pain in left leg: Secondary | ICD-10-CM | POA: Diagnosis not present

## 2019-07-03 DIAGNOSIS — M79604 Pain in right leg: Secondary | ICD-10-CM | POA: Diagnosis not present

## 2019-07-03 DIAGNOSIS — I10 Essential (primary) hypertension: Secondary | ICD-10-CM | POA: Diagnosis not present

## 2019-07-03 DIAGNOSIS — R600 Localized edema: Secondary | ICD-10-CM | POA: Diagnosis not present

## 2019-07-08 DIAGNOSIS — U071 COVID-19: Secondary | ICD-10-CM | POA: Diagnosis not present

## 2019-07-08 DIAGNOSIS — R05 Cough: Secondary | ICD-10-CM | POA: Diagnosis not present

## 2019-07-18 DIAGNOSIS — U071 COVID-19: Secondary | ICD-10-CM | POA: Diagnosis not present

## 2019-07-23 DIAGNOSIS — I10 Essential (primary) hypertension: Secondary | ICD-10-CM | POA: Diagnosis not present

## 2019-07-23 DIAGNOSIS — E782 Mixed hyperlipidemia: Secondary | ICD-10-CM | POA: Diagnosis not present

## 2019-07-23 DIAGNOSIS — U071 COVID-19: Secondary | ICD-10-CM | POA: Diagnosis not present

## 2019-07-23 DIAGNOSIS — K219 Gastro-esophageal reflux disease without esophagitis: Secondary | ICD-10-CM | POA: Diagnosis not present

## 2019-08-15 DIAGNOSIS — U071 COVID-19: Secondary | ICD-10-CM | POA: Diagnosis not present

## 2019-08-21 DIAGNOSIS — E782 Mixed hyperlipidemia: Secondary | ICD-10-CM | POA: Diagnosis not present

## 2019-08-21 DIAGNOSIS — K219 Gastro-esophageal reflux disease without esophagitis: Secondary | ICD-10-CM | POA: Diagnosis not present

## 2019-08-21 DIAGNOSIS — I10 Essential (primary) hypertension: Secondary | ICD-10-CM | POA: Diagnosis not present

## 2019-08-21 DIAGNOSIS — U071 COVID-19: Secondary | ICD-10-CM | POA: Diagnosis not present

## 2019-09-15 DIAGNOSIS — U071 COVID-19: Secondary | ICD-10-CM | POA: Diagnosis not present

## 2019-09-18 DIAGNOSIS — I10 Essential (primary) hypertension: Secondary | ICD-10-CM | POA: Diagnosis not present

## 2019-09-18 DIAGNOSIS — U071 COVID-19: Secondary | ICD-10-CM | POA: Diagnosis not present

## 2019-09-18 DIAGNOSIS — K219 Gastro-esophageal reflux disease without esophagitis: Secondary | ICD-10-CM | POA: Diagnosis not present

## 2019-09-18 DIAGNOSIS — E782 Mixed hyperlipidemia: Secondary | ICD-10-CM | POA: Diagnosis not present

## 2019-10-04 DIAGNOSIS — M25562 Pain in left knee: Secondary | ICD-10-CM | POA: Diagnosis not present

## 2019-10-15 DIAGNOSIS — U071 COVID-19: Secondary | ICD-10-CM | POA: Diagnosis not present

## 2019-10-18 DIAGNOSIS — M25562 Pain in left knee: Secondary | ICD-10-CM | POA: Diagnosis not present

## 2019-11-12 DIAGNOSIS — N210 Calculus in bladder: Secondary | ICD-10-CM | POA: Diagnosis not present

## 2019-11-12 DIAGNOSIS — Z9049 Acquired absence of other specified parts of digestive tract: Secondary | ICD-10-CM | POA: Diagnosis not present

## 2019-11-12 DIAGNOSIS — R109 Unspecified abdominal pain: Secondary | ICD-10-CM | POA: Diagnosis not present

## 2019-11-12 DIAGNOSIS — I10 Essential (primary) hypertension: Secondary | ICD-10-CM | POA: Diagnosis not present

## 2019-11-12 DIAGNOSIS — K760 Fatty (change of) liver, not elsewhere classified: Secondary | ICD-10-CM | POA: Diagnosis not present

## 2019-11-12 DIAGNOSIS — Z8616 Personal history of COVID-19: Secondary | ICD-10-CM | POA: Diagnosis not present

## 2019-11-12 DIAGNOSIS — N200 Calculus of kidney: Secondary | ICD-10-CM | POA: Diagnosis not present

## 2019-11-12 DIAGNOSIS — K219 Gastro-esophageal reflux disease without esophagitis: Secondary | ICD-10-CM | POA: Diagnosis not present

## 2019-11-12 DIAGNOSIS — R911 Solitary pulmonary nodule: Secondary | ICD-10-CM | POA: Diagnosis not present

## 2019-11-15 DIAGNOSIS — U071 COVID-19: Secondary | ICD-10-CM | POA: Diagnosis not present

## 2019-12-15 DIAGNOSIS — U071 COVID-19: Secondary | ICD-10-CM | POA: Diagnosis not present

## 2020-01-15 DIAGNOSIS — U071 COVID-19: Secondary | ICD-10-CM | POA: Diagnosis not present

## 2020-01-15 DIAGNOSIS — Z1331 Encounter for screening for depression: Secondary | ICD-10-CM | POA: Diagnosis not present

## 2020-01-15 DIAGNOSIS — K219 Gastro-esophageal reflux disease without esophagitis: Secondary | ICD-10-CM | POA: Diagnosis not present

## 2020-01-15 DIAGNOSIS — E782 Mixed hyperlipidemia: Secondary | ICD-10-CM | POA: Diagnosis not present

## 2020-01-15 DIAGNOSIS — I10 Essential (primary) hypertension: Secondary | ICD-10-CM | POA: Diagnosis not present

## 2020-02-15 DIAGNOSIS — U071 COVID-19: Secondary | ICD-10-CM | POA: Diagnosis not present

## 2020-03-16 DIAGNOSIS — U071 COVID-19: Secondary | ICD-10-CM | POA: Diagnosis not present

## 2020-04-07 DIAGNOSIS — L570 Actinic keratosis: Secondary | ICD-10-CM | POA: Diagnosis not present

## 2020-04-07 DIAGNOSIS — L814 Other melanin hyperpigmentation: Secondary | ICD-10-CM | POA: Diagnosis not present

## 2020-04-16 DIAGNOSIS — U071 COVID-19: Secondary | ICD-10-CM | POA: Diagnosis not present

## 2020-10-28 DIAGNOSIS — U071 COVID-19: Secondary | ICD-10-CM

## 2020-10-28 HISTORY — DX: COVID-19: U07.1

## 2020-11-12 ENCOUNTER — Institutional Professional Consult (permissible substitution): Payer: BC Managed Care – PPO | Admitting: Pulmonary Disease

## 2020-11-23 ENCOUNTER — Ambulatory Visit (HOSPITAL_COMMUNITY)
Admission: RE | Admit: 2020-11-23 | Discharge: 2020-11-23 | Disposition: A | Discharge status: Home / Self Care | Payer: BC Managed Care – PPO | Source: Ambulatory Visit | Attending: Internal Medicine | Admitting: Internal Medicine

## 2020-11-23 ENCOUNTER — Other Ambulatory Visit: Payer: Self-pay

## 2020-11-23 ENCOUNTER — Encounter: Payer: Self-pay | Admitting: Internal Medicine

## 2020-11-23 ENCOUNTER — Ambulatory Visit: Payer: BC Managed Care – PPO | Admitting: Internal Medicine

## 2020-11-23 ENCOUNTER — Other Ambulatory Visit (HOSPITAL_COMMUNITY)
Admission: RE | Admit: 2020-11-23 | Discharge: 2020-11-23 | Disposition: A | Discharge status: Home / Self Care | Payer: BC Managed Care – PPO | Source: Ambulatory Visit | Attending: Internal Medicine | Admitting: Internal Medicine

## 2020-11-23 VITALS — BP 160/100 | HR 71 | Temp 98.8°F | Ht 72.0 in | Wt 247.4 lb

## 2020-11-23 DIAGNOSIS — R0609 Other forms of dyspnea: Secondary | ICD-10-CM | POA: Diagnosis not present

## 2020-11-23 DIAGNOSIS — R0600 Dyspnea, unspecified: Secondary | ICD-10-CM

## 2020-11-23 DIAGNOSIS — G4710 Hypersomnia, unspecified: Secondary | ICD-10-CM | POA: Insufficient documentation

## 2020-11-23 DIAGNOSIS — I10 Essential (primary) hypertension: Secondary | ICD-10-CM

## 2020-11-23 DIAGNOSIS — Z8616 Personal history of COVID-19: Secondary | ICD-10-CM | POA: Insufficient documentation

## 2020-11-23 DIAGNOSIS — R0683 Snoring: Secondary | ICD-10-CM | POA: Diagnosis not present

## 2020-11-23 LAB — CBC WITH DIFFERENTIAL/PLATELET
Abs Immature Granulocytes: 0.01 10*3/uL (ref 0.00–0.07)
Basophils Absolute: 0.1 10*3/uL (ref 0.0–0.1)
Basophils Relative: 1 %
Eosinophils Absolute: 0.1 10*3/uL (ref 0.0–0.5)
Eosinophils Relative: 1 %
HCT: 46.3 % (ref 39.0–52.0)
Hemoglobin: 15.9 g/dL (ref 13.0–17.0)
Immature Granulocytes: 0 %
Lymphocytes Relative: 43 %
Lymphs Abs: 2.7 10*3/uL (ref 0.7–4.0)
MCH: 31.3 pg (ref 26.0–34.0)
MCHC: 34.3 g/dL (ref 30.0–36.0)
MCV: 91.1 fL (ref 80.0–100.0)
Monocytes Absolute: 0.5 10*3/uL (ref 0.1–1.0)
Monocytes Relative: 7 %
Neutro Abs: 3.1 10*3/uL (ref 1.7–7.7)
Neutrophils Relative %: 48 %
Platelets: 202 10*3/uL (ref 150–400)
RBC: 5.08 MIL/uL (ref 4.22–5.81)
RDW: 11.7 % (ref 11.5–15.5)
WBC: 6.4 10*3/uL (ref 4.0–10.5)
nRBC: 0 % (ref 0.0–0.2)

## 2020-11-23 LAB — BASIC METABOLIC PANEL
Anion gap: 6 (ref 5–15)
BUN: 11 mg/dL (ref 6–20)
CO2: 27 mmol/L (ref 22–32)
Calcium: 9.2 mg/dL (ref 8.9–10.3)
Chloride: 104 mmol/L (ref 98–111)
Creatinine, Ser: 0.86 mg/dL (ref 0.61–1.24)
GFR, Estimated: >60 mL/min (ref >60)
Glucose, Bld: 114 mg/dL — ABNORMAL HIGH (ref 70–99)
Potassium: 3.8 mmol/L (ref 3.5–5.1)
Sodium: 137 mmol/L (ref 135–145)

## 2020-11-23 LAB — BRAIN NATRIURETIC PEPTIDE: B Natriuretic Peptide: 27 pg/mL (ref 0.0–100.0)

## 2020-11-23 LAB — TSH: TSH: 1.906 u[IU]/mL (ref 0.350–4.500)

## 2020-11-23 LAB — D-DIMER, QUANTITATIVE: D-Dimer, Quant: 0.46 ug/mL-FEU (ref 0.00–0.50)

## 2020-11-23 NOTE — Assessment & Plan Note (Signed)
Split night study rec 11/23/2020   Insurance may require a HST but I'm covident it will be positive > proceed with cpap titration   Discussed in detail all the  indications, usual  risks and alternatives  relative to the benefits with patient who agrees to proceed with w/u as outlined.     F/u sleep medicine will be arranged         Each maintenance medication was reviewed in detail including emphasizing most importantly the difference between maintenance and prns and under what circumstances the prns are to be triggered using an action plan format where appropriate.  Total time for H and P, chart review, counseling, reviewing and generating customized AVS unique to this office visit / same day charting = 45 min

## 2020-11-23 NOTE — Assessment & Plan Note (Addendum)
Onset  with covid infection Jan 2021  - nl cxr 11/23/2020 / nl labs   Suspect more fatigued than sob and getting more hypersomnolent daytime since covid with snoring hx so strongly suspect OSA > see sep a/p

## 2020-11-23 NOTE — Progress Notes (Signed)
Dalton Cummings, male    DOB: 10/08/1970,    MRN: [id number]   Brief patient profile:  82 yowm never smoker very healthy with Jan 2021 original covid infection > admitted to [facility] on 2lpm which he used prn but never felt he had recovered then 2nd infection x early June 2022 paxlovid helped a lot but still only 60% back to baseline so referred to pulmonary clinic in [facility]  11/23/2020 by Bunnie Pion PA      History of Present Illness  11/23/2020  Pulmonary/ 1st office eval/ Joy Reiger / [place] [facility]  Chief Complaint  Patient presents with   Consult    Patient is here for shortness of breath with exertion that's been going on for the last year since he had covid the 1st time.Marland Kitchen Has a cough first thing in the morning non productive. The heat makes its worse. Had covid for the 2nd time 3 weeks ago.   Dyspnea:  constantly walking on job/ up  steps on job also Cough: in am but doesn't wake him up / heat makes worse  Sleep: hypersomnia / snoring chronically but getting worse, does not get sleeping driving though  SABA use: symbicort 160 not sure it helps  On ppi daily longterm 40 mg ppi and still overt hb noct  occ    No obvious day to day or daytime variability or assoc excess/ purulent sputum or mucus plugs or hemoptysis or cp or chest tightness, subjective wheeze or overt sinus symptoms.   Wife reports snoring is chronic and getting worse but he says he's sleeping  without nocturnal   exacerbation  of respiratory  c/o's or need for noct saba. Also denies any obvious fluctuation of symptoms with weather or environmental changes or other aggravating or alleviating factors except as outlined above   No unusual exposure hx or h/o childhood pna/ asthma or knowledge of premature birth.  Current Allergies, Complete Past Medical History, Past Surgical History, Family History, and Social History were reviewed in [employer] record.  ROS  The following are not  active complaints unless bolded Hoarseness, sore throat, dysphagia, dental problems, itching, sneezing,  nasal congestion or discharge of excess mucus or purulent secretions, ear ache,   fever, chills, sweats, unintended wt loss or wt gain, classically pleuritic or exertional cp,  orthopnea pnd or arm/hand swelling  or leg swelling, presyncope, palpitations, abdominal pain, anorexia, nausea, vomiting, diarrhea  or change in bowel habits or change in bladder habits, change in stools or change in urine, dysuria, hematuria,  rash, arthralgias, visual complaints, headache, numbness, weakness or ataxia or problems with walking or coordination,  change in mood or  memory.  Hypersomnolence              Past Medical History:  Diagnosis Date   GERD (gastroesophageal reflux disease)    Hyperlipidemia    Hypertension     Outpatient Medications Prior to Visit  Medication Sig Dispense Refill   albuterol (VENTOLIN HFA) 108 (90 Base) MCG/ACT inhaler Inhale 2 puffs into the lungs every 4 (four) hours as needed for wheezing or shortness of breath. 8 g 0   amLODipine (NORVASC) 2.5 MG tablet TAKE ONE (1) TABLET EACH DAY 90 tablet 4   losartan (COZAAR) 50 MG tablet TAKE ONE (1) TABLET EACH DAY 90 tablet 4   aspirin EC 81 MG tablet Take 1 tablet (81 mg total) by mouth daily. 30 tablet 11       4  MOBIC 15 MG tablet Take 15 mg by mouth daily.     omeprazole (PRILOSEC) 40 MG capsule TAKE ONE (1) CAPSULE EACH DAY (Patient taking differently: No sig reported) 30 capsule 0   pravastatin (PRAVACHOL) 20 MG tablet TAKE ONE (1) TABLET EACH DAY 90 tablet 4       Objective:     BP (!) 160/100 (BP Location: Left Arm, Patient Position: Sitting, Cuff Size: Large)   Pulse 71   Temp 98.8 F (37.1 C) (Oral)   Ht 6' (1.829 m)   Wt 247 lb 6.4 oz (112.2 kg)   SpO2 98%   BMI 33.55 kg/m   SpO2: 98 %  Pleasant healthy appearing amb wm nad   HEENT : pt wearing mask not removed for exam due to covid -19 concerns.     NECK :  without JVD/Nodes/TM/ nl carotid upstrokes bilaterally   LUNGS: no acc muscle use,  Nl contour chest which is clear to A and P bilaterally without cough on insp or exp maneuvers   CV:  RRR  no s3 or murmur or increase in P2, and no edema   ABD:  soft and nontender with nl inspiratory excursion in the supine position. No bruits or organomegaly appreciated, bowel sounds nl  MS:  Nl gait/ ext warm without deformities, calf tenderness, cyanosis or clubbing No obvious joint restrictions   SKIN: warm and dry without lesions    NEURO:  alert, approp, nl sensorium with  no motor or cerebellar deficits apparent.    CXR PA and Lateral:   11/23/2020 :    I personally reviewed images and agree with radiology impression as follows:    Nl   Labs ordered/ reviewed:      Chemistry      Component Value Date/Time   NA 137 11/23/2020 1144   NA 141 06/17/2013 1022   K 3.8 11/23/2020 1144   CL 104 11/23/2020 1144   CO2 27 11/23/2020 1144   BUN 11 11/23/2020 1144   BUN 18 06/17/2013 1022   CREATININE 0.86 11/23/2020 1144      Component Value Date/Time   CALCIUM 9.2 11/23/2020 1144   ALKPHOS 56 06/17/2013 1022   AST 24 06/17/2013 1022   ALT 33 06/17/2013 1022   BILITOT 0.5 06/17/2013 1022        Lab Results  Component Value Date   WBC 6.4 11/23/2020   HGB 15.9 11/23/2020   HCT 46.3 11/23/2020   MCV 91.1 11/23/2020   PLT 202 11/23/2020       EOS                                                              0.1                                    11/23/2020   Lab Results  Component Value Date   DDIMER 0.46 11/23/2020      Lab Results  Component Value Date   TSH 1.906 11/23/2020      BNP  11/23/2020 = 27          Assessment   DOE (dyspnea on exertion) Onset  with covid infection Jan 2021  -  nl cxr 11/23/2020 / nl labs   Suspect more fatigued than sob and getting more hypersomnolent daytime since covid with snoring hx so strongly suspect OSA > see sep  a/p    Hypersomnia assoc with snoring  Split night study rec 11/23/2020   Insurance may require a HST but I'm covident it will be positive > proceed with cpap titration   Discussed in detail all the  indications, usual  risks and alternatives  relative to the benefits with patient who agrees to proceed with w/u as outlined.     >>>  F/u sleep medicine will be arranged      Essential hypertension Poor control may be related to osa  But in any case Not optimally controlled on present regimen. I reviewed this with the patient and emphasized importance of follow-up with primary care.    Each maintenance medication was reviewed in detail including emphasizing most importantly the difference between maintenance and prns and under what circumstances the prns are to be triggered using an action plan format where appropriate.  Total time for H and P, chart review, counseling,  and generating customized AVS unique to this office visit / same day charting =45 min           Sandrea Hughs, MD 11/24/2020

## 2020-11-23 NOTE — Assessment & Plan Note (Addendum)
Poor control may be related to osa  But in any case Not optimally controlled on present regimen. I reviewed this with the patient and emphasized importance of follow-up with primary care.          Each maintenance medication was reviewed in detail including emphasizing most importantly the difference between maintenance and prns and under what circumstances the prns are to be triggered using an action plan format where appropriate.  Total time for H and P, chart review, counseling,  and generating customized AVS unique to this office visit / same day charting =45 min

## 2020-11-23 NOTE — Patient Instructions (Signed)
We will schedule a sleep study asap and get you follow up with a sleep medicine doctor here   Try prilosec (omeprazole)  40 mg  Take 30-60 min before first meal of the day and Pepcid ac (famotidine) 20 mg one @  bedtime until returns   GERD (REFLUX)  is an extremely common cause of respiratory symptoms just like yours , many times with no obvious heartburn at all.    It can be treated with medication, but also with lifestyle changes including elevation of the head of your bed (ideally with 6 -8inch blocks under the headboard of your bed),  Smoking cessation, avoidance of late meals, excessive alcohol, and avoid fatty foods, chocolate, peppermint, colas, red wine, and acidic juices such as orange juice.  NO MINT OR MENTHOL PRODUCTS SO NO COUGH DROPS  USE SUGARLESS CANDY INSTEAD (Jolley ranchers or Stover's or Life Savers) or even ice chips will also do - the key is to swallow to prevent all throat clearing. NO OIL BASED VITAMINS - use powdered substitutes.  Avoid fish oil when coughing.   Please remember to go to the lab and x-ray department at [facility]   for your tests - we will call you with the results when they are available.  I will see you back here as needed

## 2020-11-24 ENCOUNTER — Encounter: Payer: Self-pay | Admitting: Internal Medicine

## 2020-11-24 ENCOUNTER — Telehealth: Payer: Self-pay | Admitting: Internal Medicine

## 2020-11-24 NOTE — Telephone Encounter (Signed)
No need for hrct chest based on yesterday's eval

## 2020-11-24 NOTE — Telephone Encounter (Signed)
I don't see hrct chest anywhere in care everywhere - need copy of result if done

## 2020-11-24 NOTE — Telephone Encounter (Signed)
Not needed for now

## 2020-11-24 NOTE — Telephone Encounter (Signed)
Pt called stating never had PFT done. Dr Sherene Sires, did you want me to order one to be done?

## 2020-11-24 NOTE — Telephone Encounter (Signed)
Spoke with pt's spouse  He had CT Abd in June 2021 and was advised needed f/u CT Chest  She states he has only had one done since then, ordered by PCP and she is not sure where this was done  She states that he was advised needs f/u on it now, but the PCP wanted to defer to pulm  Pt scheduled f/u with Dr Craige Cotta 12/31/20  Please advise thanks

## 2020-11-25 ENCOUNTER — Encounter: Payer: Self-pay | Admitting: *Deleted

## 2020-12-08 ENCOUNTER — Ambulatory Visit: Payer: BC Managed Care – PPO | Attending: Internal Medicine | Admitting: Pulmonary Disease

## 2020-12-08 ENCOUNTER — Other Ambulatory Visit: Payer: Self-pay

## 2020-12-08 DIAGNOSIS — G4733 Obstructive sleep apnea (adult) (pediatric): Secondary | ICD-10-CM | POA: Diagnosis not present

## 2020-12-08 DIAGNOSIS — R0609 Other forms of dyspnea: Secondary | ICD-10-CM

## 2020-12-08 DIAGNOSIS — R0600 Dyspnea, unspecified: Secondary | ICD-10-CM

## 2020-12-08 DIAGNOSIS — R0683 Snoring: Secondary | ICD-10-CM | POA: Diagnosis present

## 2020-12-08 DIAGNOSIS — G4710 Hypersomnia, unspecified: Secondary | ICD-10-CM

## 2020-12-09 NOTE — Procedures (Signed)
    Patient Name: Dalton Cummings, Dalton Cummings Date: 12/08/2020 Gender: Male D.O.B: Jan 07, 1971 Age (years): 50 Referring Provider: Nyoka Cowden Height (inches): 72 Interpreting Physician: Coralyn Helling MD, ABSM Weight (lbs): 240 RPSGT: Alfonso Ellis BMI: 33 MRN: [id number] Neck Size: 19.00  CLINICAL INFORMATION Sleep Study Type: NPSG  Indication for sleep study: assessment of snoring, sleep disruption, and daytime sleepiness.  Epworth Sleepiness Score: 6  SLEEP STUDY TECHNIQUE As per the AASM Manual for the Scoring of Sleep and Associated Events v2.3 (April 2016) with a hypopnea requiring 4% desaturations.  The channels recorded and monitored were frontal, central and occipital EEG, electrooculogram (EOG), submentalis EMG (chin), nasal and oral airflow, thoracic and abdominal wall motion, anterior tibialis EMG, snore microphone, electrocardiogram, and pulse oximetry.  MEDICATIONS Medications self-administered by patient taken the night of the study : N/A  SLEEP ARCHITECTURE The study was initiated at 9:37:56 PM and ended at 4:54:20 AM.  Sleep onset time was 58.8 minutes and the sleep efficiency was 81.5%. The total sleep time was 355.6 minutes.  Stage REM latency was 91.5 minutes.  The patient spent 8.15% of the night in stage N1 sleep, 46.57% in stage N2 sleep, 21.93% in stage N3 and 23.3% in REM.  Alpha intrusion was absent.  Supine sleep was 22.17%.  RESPIRATORY PARAMETERS The overall apnea/hypopnea index (AHI) was 28.5 per hour. There were 16 total apneas, including 13 obstructive, 2 central and 1 mixed apneas. There were 153 hypopneas and 0 RERAs.  The AHI during Stage REM sleep was 49.2 per hour.  AHI while supine was 52.5 per hour.  The mean oxygen saturation was 92.19%. The minimum SpO2 during sleep was 80.00%.  loud snoring was noted during this study.  CARDIAC DATA The 2 lead EKG demonstrated sinus rhythm. The mean heart rate was 59.15 beats per  minute. Other EKG findings include: None.  LEG MOVEMENT DATA The total PLMS were 0 with a resulting PLMS index of 0.00. Associated arousal with leg movement index was 0.0 .  IMPRESSIONS - Moderate obstructive sleep apnea with an AHI of 28.5 and SpO2 low of 80%. - Significant REM effect with REM AHI 49.2. - Significant Supine effect with Supine AHI 52.5. - Supplemental oxygen was not applied during this study. - The patient snored with loud snoring volume.  DIAGNOSIS - Obstructive Sleep Apnea (G47.33)  RECOMMENDATIONS - Additional therapies include CPAP, oral appliance, or surgical assessment. - Avoid alcohol, sedatives and other CNS depressants that may worsen sleep apnea and disrupt normal sleep architecture. - Sleep hygiene should be reviewed to assess factors that may improve sleep quality. - Weight management and regular exercise should be initiated or continued if appropriate.  [Electronically signed] 12/09/2020 10:17 AM  Coralyn Helling MD, ABSM Diplomate, American Board of Sleep Medicine   NPI: [id number]

## 2020-12-10 DIAGNOSIS — R0600 Dyspnea, unspecified: Secondary | ICD-10-CM | POA: Diagnosis not present

## 2020-12-22 ENCOUNTER — Telehealth: Payer: Self-pay | Admitting: Internal Medicine

## 2020-12-22 NOTE — Telephone Encounter (Signed)
Called and spoke with patient's wife. Advised her that we will give her a call once results have been read. She verbalized understanding.   Dr. Craige Cotta please advise

## 2020-12-28 NOTE — Telephone Encounter (Signed)
Attempted to call pt but unable to reach. Left message for her to return call. 

## 2020-12-28 NOTE — Telephone Encounter (Signed)
Pt of Dr. Sherene Sires.  Sleep study shows moderate obstructive sleep apnea.  Looks like consult visit has been scheduled with me on 12/31/20.  Will discuss in more detail then.

## 2020-12-31 ENCOUNTER — Encounter: Payer: Self-pay | Admitting: Pulmonary Disease

## 2020-12-31 ENCOUNTER — Other Ambulatory Visit: Payer: Self-pay

## 2020-12-31 ENCOUNTER — Ambulatory Visit: Payer: BC Managed Care – PPO | Admitting: Pulmonary Disease

## 2020-12-31 VITALS — BP 140/100 | HR 76 | Temp 99.0°F | Ht 72.0 in | Wt 249.0 lb

## 2020-12-31 DIAGNOSIS — Z7189 Other specified counseling: Secondary | ICD-10-CM | POA: Diagnosis not present

## 2020-12-31 DIAGNOSIS — G4733 Obstructive sleep apnea (adult) (pediatric): Secondary | ICD-10-CM | POA: Diagnosis not present

## 2020-12-31 NOTE — Telephone Encounter (Signed)
Pt seen today 12/31/20 by Dr. Craige Cotta. Will close encounter.

## 2020-12-31 NOTE — Progress Notes (Signed)
n  [facility]  Chief Complaint  Patient presents with   Follow-up    Patient did sleep study and is here to discuss results and treatment options.    Constitutional:  BP (!) 140/100 (BP Location: Left Arm, Patient Position: Sitting, Cuff Size: Normal)   Pulse 76   Temp 99 F (37.2 C) (Oral)   Ht 6' (1.829 m)   Wt 249 lb (112.9 kg)   SpO2 97%   BMI 33.77 kg/m   Past Medical History:  GERD, HLD, HTN, COVID pneumonia January 2021, COVID June 2022  Past Surgical History:  He  has a past surgical history that includes Appendectomy; right ankle  repair; left elbow surgery; and left rotator cuff.  Brief Summary:  Dalton Cummings is a 50 y.o. male with obstructive sleep apnea.      Subjective:   He had COVID in January 2021 and again in June 2022.  He was having cough, wheeze, and dyspnea.  He was seen by Dr. Sherene Sires.  Noted to have improvement in respiratory symptoms but still having daytime fatigue.  This was associated with sleep disruption, snoring, and apnea.  He had sleep study on 12/08/20 and this showed moderate sleep apnea.  Physical Exam:   Appearance - well kempt   ENMT - no sinus tenderness, no oral exudate, no LAN, Mallampati 3 airway, no stridor  Respiratory - equal breath sounds bilaterally, no wheezing or rales  CV - s1s2 regular rate and rhythm, no murmurs  Ext - no clubbing, no edema  Skin - no rashes  Psych - normal mood and affect   Sleep Tests:  PSG 12/08/20 >> AHI 28.5, SpO2 low 80%  Social History:  He  reports that he has never smoked. He has never used smokeless tobacco. He reports that he does not drink alcohol and does not use drugs.  Family History:  His family history includes Diabetes in his father; Heart disease in his father; Sleep apnea in his mother.    Discussion:  He has snoring, sleep disruption, apnea, and daytime sleepiness.  He has history of hypertension.  Sleep study shows moderate  obstructive sleep apnea.  Assessment/Plan:   Snoring with excessive daytime sleepiness from obstructive sleep apnea. - reviewed his sleep study results - treatment options discussed - will arrange for auto CPAP set up - he will check with his dentist whether he is a candidate for an oral appliance  Obesity. - discussed how weight can impact sleep and risk for sleep disordered breathing - discussed options to assist with weight loss: combination of diet modification, cardiovascular and strength training exercises  Cardiovascular risk. - had an extensive discussion regarding the adverse health consequences related to untreated sleep disordered breathing - specifically discussed the risks for hypertension, coronary artery disease, cardiac dysrhythmias, cerebrovascular disease, and diabetes - lifestyle modification discussed  Safe driving practices. - discussed how sleep disruption can increase risk of accidents, particularly when driving - safe driving practices were discussed  Hypertension. - BP elevated today - he will follow up with his PCP  Time Spent Involved in Patient Care on Day of Examination:  32 minutes  Follow up:   Patient Instructions  Will arrange for auto CPAP set up  Follow up in 4 months  Medication List:   Allergies as of 12/31/2020       Reactions   Codeine    Penicillins         Medication List  Accurate as of December 31, 2020 10:05 AM. If you have any questions, ask your nurse or doctor.          albuterol 108 (90 Base) MCG/ACT inhaler Commonly known as: VENTOLIN HFA Inhale 2 puffs into the lungs every 4 (four) hours as needed for wheezing or shortness of breath.   amLODipine 2.5 MG tablet Commonly known as: NORVASC TAKE ONE (1) TABLET EACH DAY   losartan 50 MG tablet Commonly known as: COZAAR TAKE ONE (1) TABLET EACH DAY        Signature:  Coralyn Helling, MD [place] [facility] Pager - [phone] 12/31/2020, 10:05 AM

## 2020-12-31 NOTE — Patient Instructions (Signed)
Will arrange for auto CPAP set up  Follow up in 4 months 

## 2021-02-12 IMAGING — DX DG CHEST 1V PORT
1 series · 1 of 1 positions shown · non-contrast
Comparison: None.

CLINICAL DATA: Shortness of breath

EXAM:
PORTABLE CHEST 1 VIEW

[chest ap grid]
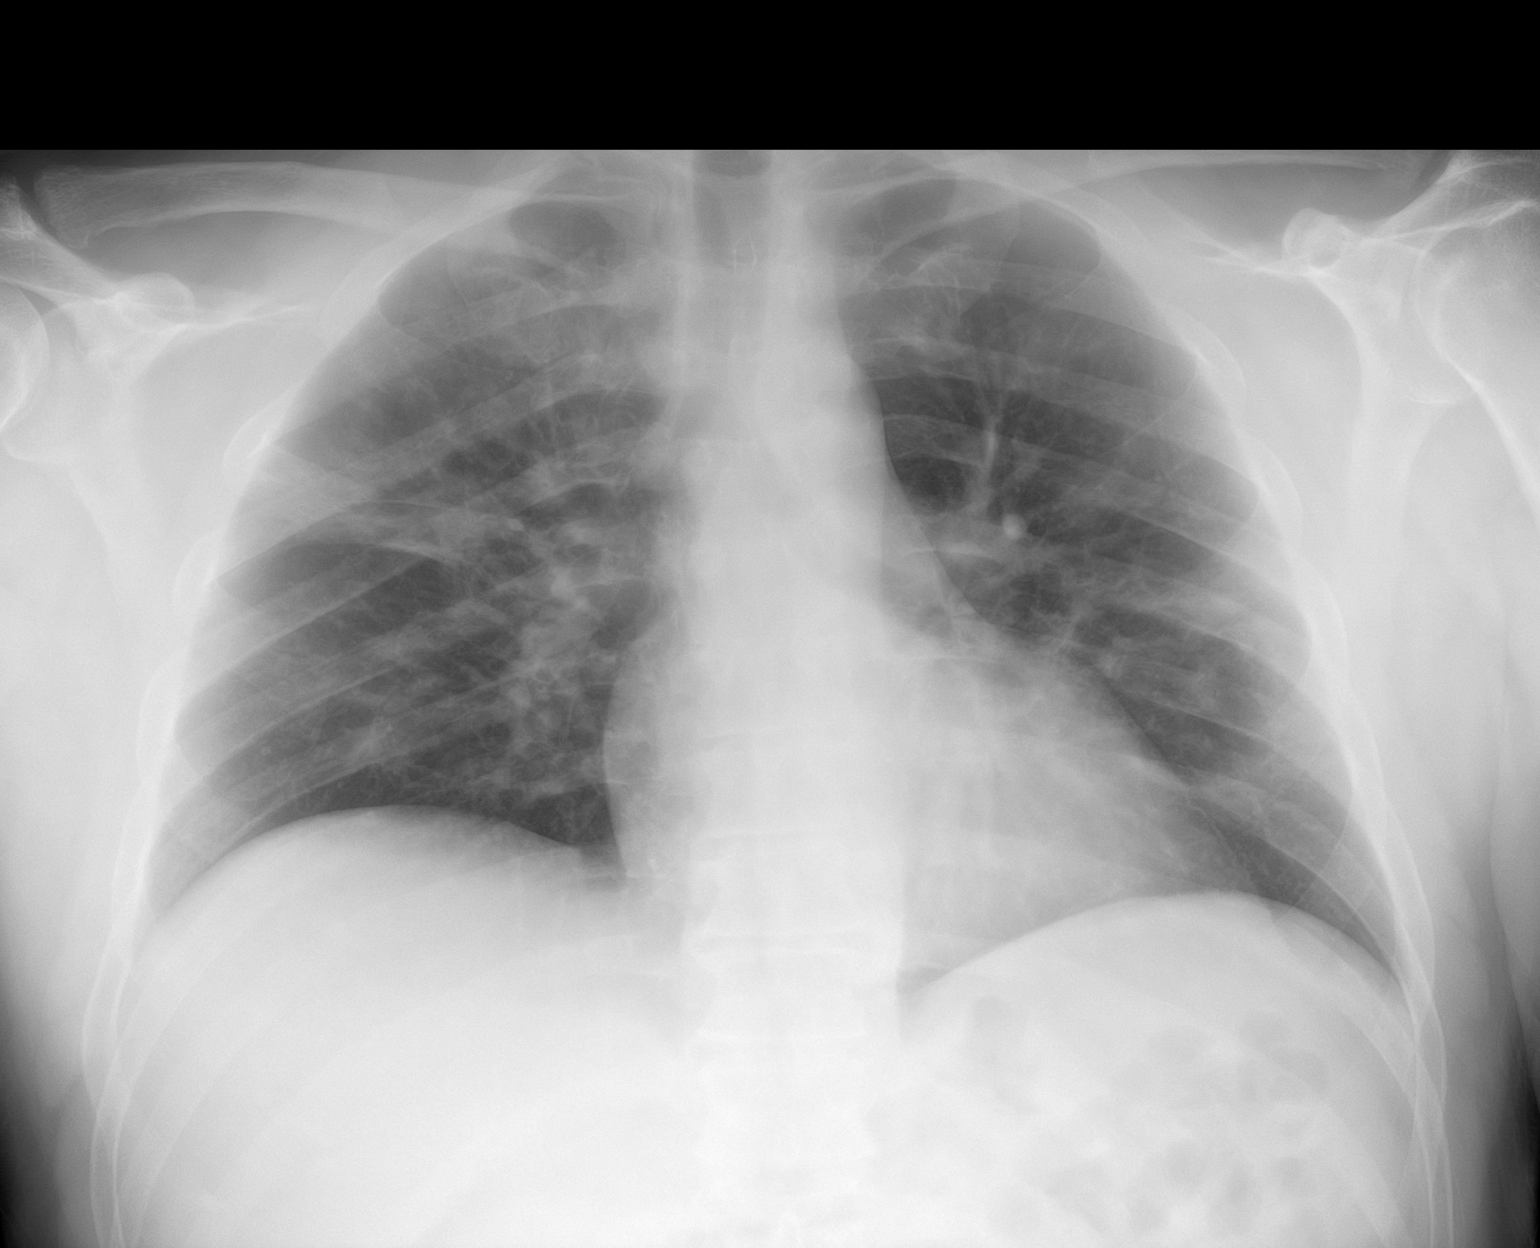

[1 of 1 positions shown; findings below may reference images not displayed]

FINDINGS: Patchy bilateral airspace opacities compatible with pneumonia. Heart
is normal size. No effusions. No acute bony abnormality.
IMPRESSION: Patchy bilateral airspace opacities compatible with pneumonia.

## 2022-05-16 ENCOUNTER — Other Ambulatory Visit: Payer: Self-pay

## 2022-05-16 ENCOUNTER — Encounter (HOSPITAL_BASED_OUTPATIENT_CLINIC_OR_DEPARTMENT_OTHER): Payer: Self-pay | Admitting: Orthopedic Surgery

## 2022-05-19 ENCOUNTER — Ambulatory Visit: Payer: Self-pay | Admitting: Physician Assistant

## 2022-05-19 NOTE — H&P (Signed)
Dalton Cummings is an 51 y.o. male.   Chief Complaint: right knee pain HPI: To review: This patient had his other knee scoped by me and did well. He has had injections in this knee without relief. 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   Past Medical History:  Diagnosis Date  . COVID-19 virus infection 10/2020  . GERD (gastroesophageal reflux disease)   . Hyperlipidemia   . Hypertension   . OSA (obstructive sleep apnea)    uses CPAP nightly  . Pneumonia due to COVID-19 virus 05/2019    Past Surgical History:  Procedure Laterality Date  . APPENDECTOMY    . left elbow surgery    . left rotator cuff    . right ankle  repair      Family History  Problem Relation Age of Onset  . Sleep apnea Mother   . Diabetes Father   . Heart disease Father    Social History:  reports that he has never smoked. He has never used smokeless tobacco. He reports current alcohol use. He reports that he does not use drugs.  Allergies:  Allergies  Allergen Reactions  . Codeine   . Penicillins     (Not in a hospital admission)   No results found for this or any previous visit (from the past 48 hour(s)). No results found.  Review of Systems  Musculoskeletal:  Positive for arthralgias.  All other systems reviewed and are negative.  There were no vitals taken for this visit. Physical Exam Constitutional:      Appearance: Normal appearance.  HENT:     Head: Normocephalic and atraumatic.  Eyes:     Extraocular Movements: Extraocular movements intact.     Pupils: Pupils are equal, round, and reactive to light.  Cardiovascular:     Rate and Rhythm: Normal rate.     Pulses: Normal pulses.  Pulmonary:     Effort: Pulmonary effort is normal.  Abdominal:     General: Abdomen is flat. There is no distension.  Musculoskeletal:     Cervical back: Normal range of motion.     Comments: On examination right  knee there is a positive joint line tenderness medially. Positive McMurray.  No lateral joint line tenderness.   Skin:    General: Skin is warm and dry.     Findings: No erythema or rash.  Neurological:     General: No focal deficit present.     Mental Status: He is alert and oriented to person, place, and time.  Psychiatric:        Mood and Affect: Mood normal.        Behavior: Behavior normal.     Assessment/Plan He is self-employed, he owns his own company.  At this point he has catching and giving away of the knee, this has been going on for several months. He has not responded to conservative treatment. Previous problem with the opposite knee which was helped greatly by arthroscopic surgery. I recommend a right knee arthroscopy, medial meniscectomy and chondroplasty. The risks and benefits were discussed in detail. He is definitely interested in trying to get this done before the end of the year or as soon as possible. Follow-up pending authorization for surgery.  Margart Sickles, PA-C 05/19/2022, 7:35 AM

## 2022-05-19 NOTE — H&P (View-Only) (Signed)
Dalton Cummings is an 51 y.o. male.   Chief Complaint: right knee pain HPI: To review: This patient had his other knee scoped by me and did well. He has had injections in this knee without relief. 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   Past Medical History:  Diagnosis Date  . COVID-19 virus infection 10/2020  . GERD (gastroesophageal reflux disease)   . Hyperlipidemia   . Hypertension   . OSA (obstructive sleep apnea)    uses CPAP nightly  . Pneumonia due to COVID-19 virus 05/2019    Past Surgical History:  Procedure Laterality Date  . APPENDECTOMY    . left elbow surgery    . left rotator cuff    . right ankle  repair      Family History  Problem Relation Age of Onset  . Sleep apnea Mother   . Diabetes Father   . Heart disease Father    Social History:  reports that he has never smoked. He has never used smokeless tobacco. He reports current alcohol use. He reports that he does not use drugs.  Allergies:  Allergies  Allergen Reactions  . Codeine   . Penicillins     (Not in a hospital admission)   No results found for this or any previous visit (from the past 48 hour(s)). No results found.  Review of Systems  Musculoskeletal:  Positive for arthralgias.  All other systems reviewed and are negative.  There were no vitals taken for this visit. Physical Exam Constitutional:      Appearance: Normal appearance.  HENT:     Head: Normocephalic and atraumatic.  Eyes:     Extraocular Movements: Extraocular movements intact.     Pupils: Pupils are equal, round, and reactive to light.  Cardiovascular:     Rate and Rhythm: Normal rate.     Pulses: Normal pulses.  Pulmonary:     Effort: Pulmonary effort is normal.  Abdominal:     General: Abdomen is flat. There is no distension.  Musculoskeletal:     Cervical back: Normal range of motion.     Comments: On examination right  knee there is a positive joint line tenderness medially. Positive McMurray.  No lateral joint line tenderness.   Skin:    General: Skin is warm and dry.     Findings: No erythema or rash.  Neurological:     General: No focal deficit present.     Mental Status: He is alert and oriented to person, place, and time.  Psychiatric:        Mood and Affect: Mood normal.        Behavior: Behavior normal.     Assessment/Plan He is self-employed, he owns his own company.  At this point he has catching and giving away of the knee, this has been going on for several months. He has not responded to conservative treatment. Previous problem with the opposite knee which was helped greatly by arthroscopic surgery. I recommend a right knee arthroscopy, medial meniscectomy and chondroplasty. The risks and benefits were discussed in detail. He is definitely interested in trying to get this done before the end of the year or as soon as possible. Follow-up pending authorization for surgery.  Sonji Starkes, PA-C 05/19/2022, 7:35 AM    

## 2022-05-20 ENCOUNTER — Encounter (HOSPITAL_BASED_OUTPATIENT_CLINIC_OR_DEPARTMENT_OTHER): Payer: Self-pay | Admitting: Orthopedic Surgery

## 2022-05-20 ENCOUNTER — Ambulatory Visit (HOSPITAL_BASED_OUTPATIENT_CLINIC_OR_DEPARTMENT_OTHER): Payer: BC Managed Care – PPO | Admitting: Anesthesiology

## 2022-05-20 ENCOUNTER — Other Ambulatory Visit: Payer: Self-pay

## 2022-05-20 ENCOUNTER — Encounter (HOSPITAL_BASED_OUTPATIENT_CLINIC_OR_DEPARTMENT_OTHER)
Admission: RE | Disposition: A | Discharge status: Home / Self Care | Payer: Self-pay | Source: Home / Self Care | Attending: Orthopedic Surgery

## 2022-05-20 ENCOUNTER — Ambulatory Visit (HOSPITAL_BASED_OUTPATIENT_CLINIC_OR_DEPARTMENT_OTHER)
Admission: RE | Admit: 2022-05-20 | Discharge: 2022-05-20 | Disposition: A | Discharge status: Home / Self Care | Payer: BC Managed Care – PPO | Attending: Orthopedic Surgery | Admitting: Orthopedic Surgery

## 2022-05-20 DIAGNOSIS — M1711 Unilateral primary osteoarthritis, right knee: Secondary | ICD-10-CM | POA: Diagnosis present

## 2022-05-20 DIAGNOSIS — K219 Gastro-esophageal reflux disease without esophagitis: Secondary | ICD-10-CM | POA: Insufficient documentation

## 2022-05-20 DIAGNOSIS — I10 Essential (primary) hypertension: Secondary | ICD-10-CM | POA: Insufficient documentation

## 2022-05-20 DIAGNOSIS — X58XXXA Exposure to other specified factors, initial encounter: Secondary | ICD-10-CM | POA: Diagnosis not present

## 2022-05-20 DIAGNOSIS — S83231A Complex tear of medial meniscus, current injury, right knee, initial encounter: Secondary | ICD-10-CM | POA: Insufficient documentation

## 2022-05-20 HISTORY — PX: KNEE ARTHROSCOPY WITH MEDIAL MENISECTOMY: SHX5651

## 2022-05-20 SURGERY — ARTHROSCOPY, KNEE, WITH MEDIAL MENISCECTOMY
Anesthesia: General | Site: Knee | Laterality: Right

## 2022-05-20 MED ORDER — SODIUM CHLORIDE 0.9 % IV SOLN: INTRAVENOUS | Status: DC

## 2022-05-20 MED ORDER — EPINEPHRINE PF 1 MG/ML IJ SOLN
INTRAMUSCULAR | Status: AC
  Filled 2022-05-20: qty 3

## 2022-05-20 MED ORDER — CEFAZOLIN SODIUM-DEXTROSE 2-4 GM/100ML-% IV SOLN
INTRAVENOUS | Status: AC
  Filled 2022-05-20: qty 100

## 2022-05-20 MED ORDER — METHYLPREDNISOLONE ACETATE 80 MG/ML IJ SUSP
INTRAMUSCULAR | Status: AC
  Filled 2022-05-20: qty 1

## 2022-05-20 MED ORDER — MEPERIDINE HCL 25 MG/ML IJ SOLN: 6.2500 mg | INTRAMUSCULAR | Status: DC | PRN

## 2022-05-20 MED ORDER — LACTATED RINGERS IV SOLN: INTRAVENOUS | Status: DC

## 2022-05-20 MED ORDER — PHENYLEPHRINE HCL (PRESSORS) 10 MG/ML IV SOLN
INTRAVENOUS | Status: DC | PRN
  Administered 2022-05-20: 160 ug via INTRAVENOUS

## 2022-05-20 MED ORDER — METHYLPREDNISOLONE ACETATE 80 MG/ML IJ SUSP
INTRAMUSCULAR | Status: DC | PRN
  Administered 2022-05-20: 80 mg via INTRA_ARTICULAR

## 2022-05-20 MED ORDER — FENTANYL CITRATE (PF) 100 MCG/2ML IJ SOLN: 25.0000 ug | INTRAMUSCULAR | Status: DC | PRN

## 2022-05-20 MED ORDER — ONDANSETRON HCL 4 MG/2ML IJ SOLN: 4.0000 mg | Freq: Once | INTRAMUSCULAR | Status: DC | PRN

## 2022-05-20 MED ORDER — BUPIVACAINE-EPINEPHRINE (PF) 0.5% -1:200000 IJ SOLN
INTRAMUSCULAR | Status: AC
  Filled 2022-05-20: qty 30

## 2022-05-20 MED ORDER — MIDAZOLAM HCL 2 MG/2ML IJ SOLN
INTRAMUSCULAR | Status: AC
  Filled 2022-05-20: qty 2

## 2022-05-20 MED ORDER — HYDROCODONE-ACETAMINOPHEN 5-325 MG PO TABS: 1.0000 | ORAL_TABLET | ORAL | 0 refills | Status: AC | PRN

## 2022-05-20 MED ORDER — DEXAMETHASONE SODIUM PHOSPHATE 10 MG/ML IJ SOLN
INTRAMUSCULAR | Status: DC | PRN
  Administered 2022-05-20: 5 mg via INTRAVENOUS

## 2022-05-20 MED ORDER — BUPIVACAINE-EPINEPHRINE (PF) 0.25% -1:200000 IJ SOLN
INTRAMUSCULAR | Status: AC
  Filled 2022-05-20: qty 30

## 2022-05-20 MED ORDER — ONDANSETRON HCL 4 MG/2ML IJ SOLN
INTRAMUSCULAR | Status: AC
  Filled 2022-05-20: qty 2

## 2022-05-20 MED ORDER — MIDAZOLAM HCL 5 MG/5ML IJ SOLN
INTRAMUSCULAR | Status: DC | PRN
  Administered 2022-05-20: 2 mg via INTRAVENOUS

## 2022-05-20 MED ORDER — ACETAMINOPHEN 325 MG PO TABS: 325.0000 mg | ORAL_TABLET | ORAL | Status: DC | PRN

## 2022-05-20 MED ORDER — METHYLPREDNISOLONE ACETATE 40 MG/ML IJ SUSP
INTRAMUSCULAR | Status: AC
  Filled 2022-05-20: qty 1

## 2022-05-20 MED ORDER — ACETAMINOPHEN 160 MG/5ML PO SOLN: 325.0000 mg | ORAL | Status: DC | PRN

## 2022-05-20 MED ORDER — VANCOMYCIN HCL IN DEXTROSE 1-5 GM/200ML-% IV SOLN
INTRAVENOUS | Status: AC
  Filled 2022-05-20: qty 200

## 2022-05-20 MED ORDER — FENTANYL CITRATE (PF) 100 MCG/2ML IJ SOLN
INTRAMUSCULAR | Status: DC | PRN
  Administered 2022-05-20 (×2): 50 ug via INTRAVENOUS

## 2022-05-20 MED ORDER — SODIUM CHLORIDE 0.9 % IR SOLN: Status: DC | PRN

## 2022-05-20 MED ORDER — PROPOFOL 10 MG/ML IV BOLUS
INTRAVENOUS | Status: DC | PRN
  Administered 2022-05-20: 200 mg via INTRAVENOUS

## 2022-05-20 MED ORDER — PROPOFOL 10 MG/ML IV BOLUS
INTRAVENOUS | Status: AC
  Filled 2022-05-20: qty 20

## 2022-05-20 MED ORDER — OXYCODONE HCL 5 MG PO TABS: 5.0000 mg | ORAL_TABLET | Freq: Once | ORAL | Status: DC | PRN

## 2022-05-20 MED ORDER — OXYCODONE HCL 5 MG/5ML PO SOLN: 5.0000 mg | Freq: Once | ORAL | Status: DC | PRN

## 2022-05-20 MED ORDER — CELECOXIB 200 MG PO CAPS: 200.0000 mg | ORAL_CAPSULE | Freq: Two times a day (BID) | ORAL | 0 refills | Status: AC

## 2022-05-20 MED ORDER — BUPIVACAINE-EPINEPHRINE 0.5% -1:200000 IJ SOLN
INTRAMUSCULAR | Status: DC | PRN
  Administered 2022-05-20: 20 mL

## 2022-05-20 MED ORDER — ASPIRIN 81 MG PO TBEC: 81.0000 mg | DELAYED_RELEASE_TABLET | Freq: Two times a day (BID) | ORAL | 0 refills | Status: AC

## 2022-05-20 MED ORDER — FENTANYL CITRATE (PF) 100 MCG/2ML IJ SOLN
INTRAMUSCULAR | Status: AC
  Filled 2022-05-20: qty 2

## 2022-05-20 MED ORDER — VANCOMYCIN HCL IN DEXTROSE 1-5 GM/200ML-% IV SOLN
1000.0000 mg | INTRAVENOUS | Status: AC
  Administered 2022-05-20: 1000 mg via INTRAVENOUS

## 2022-05-20 MED ORDER — LIDOCAINE HCL (CARDIAC) PF 100 MG/5ML IV SOSY
PREFILLED_SYRINGE | INTRAVENOUS | Status: DC | PRN
  Administered 2022-05-20: 80 mg via INTRATRACHEAL

## 2022-05-20 MED ORDER — KETOROLAC TROMETHAMINE 30 MG/ML IJ SOLN
INTRAMUSCULAR | Status: DC | PRN
  Administered 2022-05-20: 30 mg via INTRAVENOUS

## 2022-05-20 MED ORDER — ONDANSETRON HCL 4 MG/2ML IJ SOLN
INTRAMUSCULAR | Status: DC | PRN
  Administered 2022-05-20: 4 mg via INTRAVENOUS

## 2022-05-20 SURGICAL SUPPLY — 34 items
BANDAGE ESMARK 6X9 LF (GAUZE/BANDAGES/DRESSINGS) IMPLANT
BLADE EXCALIBUR 4.0X13 (MISCELLANEOUS) ×1 IMPLANT
BNDG ELASTIC 6X5.8 VLCR STR LF (GAUZE/BANDAGES/DRESSINGS) IMPLANT
BNDG ESMARK 6X9 LF (GAUZE/BANDAGES/DRESSINGS)
BNDG GAUZE DERMACEA FLUFF 4 (GAUZE/BANDAGES/DRESSINGS) ×1 IMPLANT
CUFF TOURN SGL QUICK 34 (TOURNIQUET CUFF)
CUFF TRNQT CYL 34X4.125X (TOURNIQUET CUFF) ×1 IMPLANT
DISSECTOR 3.5MM X 13CM CVD (MISCELLANEOUS) IMPLANT
DISSECTOR 4.0MM X 13CM (MISCELLANEOUS) IMPLANT
DRAPE ARTHROSCOPY W/POUCH 90 (DRAPES) ×1 IMPLANT
DRSG EMULSION OIL 3X3 NADH (GAUZE/BANDAGES/DRESSINGS) ×1 IMPLANT
DURAPREP 26ML APPLICATOR (WOUND CARE) ×1 IMPLANT
EXCALIBUR 3.8MM X 13CM (MISCELLANEOUS) ×1 IMPLANT
GAUZE SPONGE 4X4 12PLY STRL (GAUZE/BANDAGES/DRESSINGS) ×1 IMPLANT
GLOVE BIO SURGEON STRL SZ7.5 (GLOVE) ×1 IMPLANT
GLOVE BIOGEL PI IND STRL 8 (GLOVE) ×2 IMPLANT
GLOVE SURG ORTHO 8.0 STRL STRW (GLOVE) ×1 IMPLANT
GOWN STRL REUS W/ TWL LRG LVL3 (GOWN DISPOSABLE) ×1 IMPLANT
GOWN STRL REUS W/ TWL XL LVL3 (GOWN DISPOSABLE) ×1 IMPLANT
GOWN STRL REUS W/TWL LRG LVL3 (GOWN DISPOSABLE)
GOWN STRL REUS W/TWL XL LVL3 (GOWN DISPOSABLE) ×3 IMPLANT
HOLDER KNEE FOAM BLUE (MISCELLANEOUS) ×1 IMPLANT
MANIFOLD NEPTUNE II (INSTRUMENTS) ×1 IMPLANT
NDL SAFETY ECLIP 18X1.5 (MISCELLANEOUS) ×1 IMPLANT
PACK ARTHROSCOPY DSU (CUSTOM PROCEDURE TRAY) ×1 IMPLANT
PACK BASIN DAY SURGERY FS (CUSTOM PROCEDURE TRAY) ×1 IMPLANT
PORT APPOLLO RF 90DEGREE MULTI (SURGICAL WAND) IMPLANT
SUT ETHILON 4 0 PS 2 18 (SUTURE) ×1 IMPLANT
SYR 3ML 23GX1 SAFETY (SYRINGE) IMPLANT
SYR 5ML LL (SYRINGE) ×1 IMPLANT
TOWEL GREEN STERILE FF (TOWEL DISPOSABLE) ×1 IMPLANT
TUBING ARTHROSCOPY IRRIG 16FT (MISCELLANEOUS) ×1 IMPLANT
WATER STERILE IRR 1000ML POUR (IV SOLUTION) ×1 IMPLANT
WRAP KNEE MAXI GEL POST OP (GAUZE/BANDAGES/DRESSINGS) ×1 IMPLANT

## 2022-05-20 NOTE — Anesthesia Procedure Notes (Signed)
Procedure Name: LMA Insertion Date/Time: 05/20/2022 12:54 PM  Performed by: Thornell Mule, CRNAPre-anesthesia Checklist: Patient identified, Emergency Drugs available, Suction available and Patient being monitored Patient Re-evaluated:Patient Re-evaluated prior to induction Oxygen Delivery Method: Circle system utilized Preoxygenation: Pre-oxygenation with 100% oxygen Induction Type: IV induction LMA: LMA inserted LMA Size: 4.0 Number of attempts: 1 Placement Confirmation: positive ETCO2 Tube secured with: Tape Dental Injury: Teeth and Oropharynx as per pre-operative assessment

## 2022-05-20 NOTE — Anesthesia Postprocedure Evaluation (Signed)
Anesthesia Post Note  Patient: Dalton Cummings  Procedure(s) Performed: KNEE ARTHROSCOPY WITH MEDIAL MENISECTOMY (Right: Knee)     Patient location during evaluation: PACU Anesthesia Type: General Level of consciousness: awake Pain management: pain level controlled Vital Signs Assessment: post-procedure vital signs reviewed and stable Respiratory status: spontaneous breathing Cardiovascular status: stable Postop Assessment: no apparent nausea or vomiting Anesthetic complications: no   No notable events documented.  Last Vitals:  Vitals:   05/20/22 1400 05/20/22 1409  BP: (!) 142/95   Pulse: 82 76  Resp: 14 18  Temp:  36.7 C  SpO2: 95%     Last Pain:  Vitals:   05/20/22 1409  TempSrc: Oral  PainSc: 0-No pain                 Kashina Mecum

## 2022-05-20 NOTE — Op Note (Signed)
Dalton Cummings, Dalton Cummings MEDICAL RECORD NO: [id number] ACCOUNT NO: [id number] DATE OF BIRTH: 03/14/1971 FACILITY: [facility] LOCATION: [facility] PHYSICIAN: W D. Carloyn Manner., MD  Operative Report   DATE OF PROCEDURE: 05/20/2022  PREOPERATIVE DIAGNOSES: 1.  Complex tear, medial meniscus, right knee. 2.  Osteoarthritis, right knee.  POSTOPERATIVE DIAGNOSES: 1.  Complex tear, medial meniscus, right knee. 2.  Osteoarthritis, right knee.  PROCEDURE: 1.  Partial medial meniscectomy. 2.  Debridement, chondroplasty patellofemoral joint, medial compartment.  SURGEON:  W D. Carloyn Manner., MD.  ANESTHESIA:  General with local.  DESCRIPTION OF PROCEDURE:  Leg holder inferomedial, inferolateral portal was created.  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   This was debrided.  Lateral side of the knee.  Lateral meniscus was normal.  Medial side of the knee showed a complex radial based tear of the meniscus at the junction of the posterior one-third, anterior 2/3 relatively deep radial tear with some  complexity. We were able to leave a rim posteriorly tapering this anteriorly involving the posterior 40-50% of the meniscus with removal of probably 25-30% of the meniscus substance. Very minimal degenerative changes noted on the tibia with more  generalized grade III changes on the femoral condyle medially, which were debrided with no grade IV changes appreciated.  Again, partial medial meniscectomy was carried out as well as chondroplasty.  Knee drained free of fluid.  Portals were closed with  nylon.  Portals were infiltrated with Marcaine with the addition of Marcaine with Depo-Medrol intraarticularly.  Taken to recovery room in stable condition.   PUS D: 05/20/2022 1:28:25 pm T: 05/20/2022 1:41:00 pm  JOB: [id number]/ [id number]

## 2022-05-20 NOTE — Interval H&P Note (Signed)
History and Physical Interval Note:  05/20/2022 12:09 PM  Dalton Cummings  has presented today for surgery, with the diagnosis of RIGHT KNEE MEDIAL MENISCUS TEAR.  The various methods of treatment have been discussed with the patient and family. After consideration of risks, benefits and other options for treatment, the patient has consented to  Procedure(s): KNEE ARTHROSCOPY WITH MEDIAL MENISECTOMY (Right) as a surgical intervention.  The patient's history has been reviewed, patient examined, no change in status, stable for surgery.  I have reviewed the patient's chart and labs.  Questions were answered to the patient's satisfaction.     Thera Flake

## 2022-05-20 NOTE — Transfer of Care (Signed)
Immediate Anesthesia Transfer of Care Note  Patient: Dalton Cummings  Procedure(s) Performed: KNEE ARTHROSCOPY WITH MEDIAL MENISECTOMY (Right: Knee)  Patient Location: PACU  Anesthesia Type:General  Level of Consciousness: drowsy  Airway & Oxygen Therapy: Patient Spontanous Breathing and Patient connected to face mask oxygen  Post-op Assessment: Report given to RN and Post -op Vital signs reviewed and stable  Post vital signs: Reviewed and stable  Last Vitals:   BP: 153/81 (101) Vitals Value Taken Time  BP    Temp    Pulse 86 05/20/22 1337  Resp 12 05/20/22 1337  SpO2 98 % 05/20/22 1337  Vitals shown include unvalidated device data.  Last Pain:  Vitals:   05/20/22 1026  TempSrc: Oral  PainSc: 4       Patients Stated Pain Goal: 5 (05/20/22 1026)  Complications: No notable events documented.

## 2022-05-20 NOTE — Anesthesia Preprocedure Evaluation (Addendum)
Anesthesia Evaluation  Patient identified by MRN, date of birth, ID band Patient awake  General Assessment Comment:History noted Dr. Chilton Si  Reviewed: Allergy & Precautions, NPO status   Airway Mallampati: II       Dental   Pulmonary    breath sounds clear to auscultation       Cardiovascular hypertension, + DOE   Rhythm:Regular Rate:Normal  History noted Dr. Chilton Si   Neuro/Psych    GI/Hepatic Neg liver ROS,GERD  ,,  Endo/Other  negative endocrine ROS    Renal/GU negative Renal ROS     Musculoskeletal   Abdominal   Peds  Hematology   Anesthesia Other Findings   Reproductive/Obstetrics                             Anesthesia Physical Anesthesia Plan  ASA: 3  Anesthesia Plan: General   Post-op Pain Management: Tylenol PO (pre-op)*   Induction: Intravenous  PONV Risk Score and Plan: 2 and Ondansetron, Dexamethasone and Midazolam  Airway Management Planned: LMA  Additional Equipment:   Intra-op Plan:   Post-operative Plan: Extubation in OR  Informed Consent: I have reviewed the patients History and Physical, chart, labs and discussed the procedure including the risks, benefits and alternatives for the proposed anesthesia with the patient or authorized representative who has indicated his/her understanding and acceptance.     Dental advisory given  Plan Discussed with: Anesthesiologist  Anesthesia Plan Comments:        Anesthesia Quick Evaluation

## 2022-05-20 NOTE — Discharge Instructions (Addendum)
Diet: As you were doing prior to hospitalization   Activity: Increase activity slowly as tolerated  No lifting or driving for 48 hrs  Shower: May shower without a dressing on post op day #3, NO SOAKING in tub   Dressing: You may change your dressing on post op day #3.  Then change the dressing daily with sterile 4"x4"s gauze dressing  Or band aids.  Weight Bearing: weight bearing as tolerated.  To prevent constipation: you may use a stool softener such as -  Colace ( over the counter) 100 mg by mouth twice a day  Drink plenty of fluids ( prune juice may be helpful) and high fiber foods  Miralax ( over the counter) for constipation as needed.   Precautions: If you experience chest pain or shortness of breath - call 911 immediately For transfer to the hospital emergency department!!  If you develop a fever greater that 101 F, purulent drainage from wound, increased redness or drainage from wound, or calf pain -- Call the office   Follow- Up Appointment: Please call for an appointment to be seen in 2 weeks  [place] - [phone]   Post Anesthesia Home Care Instructions  Activity: Get plenty of rest for the remainder of the day. A responsible individual must stay with you for 24 hours following the procedure.  For the next 24 hours, DO NOT: -Drive a car -Advertising copywriter -Drink alcoholic beverages -Take any medication unless instructed by your physician -Make any legal decisions or sign important papers.  Meals: Start with liquid foods such as gelatin or soup. Progress to regular foods as tolerated. Avoid greasy, spicy, heavy foods. If nausea and/or vomiting occur, drink only clear liquids until the nausea and/or vomiting subsides. Call your physician if vomiting continues.  Special Instructions/Symptoms: Your throat may feel dry or sore from the anesthesia or the breathing tube placed in your throat during surgery. If this causes discomfort, gargle with warm salt water. The  discomfort should disappear within 24 hours.  If you had a scopolamine patch placed behind your ear for the management of post- operative nausea and/or vomiting:  1. The medication in the patch is effective for 72 hours, after which it should be removed.  Wrap patch in a tissue and discard in the trash. Wash hands thoroughly with soap and water. 2. You may remove the patch earlier than 72 hours if you experience unpleasant side effects which may include dry mouth, dizziness or visual disturbances. 3. Avoid touching the patch. Wash your hands with soap and water after contact with the patch.

## 2022-07-29 IMAGING — DX DG CHEST 2V
2 series · 2 of 2 positions shown · non-contrast
Comparison: 06/12/2019

CLINICAL DATA: Post COVID. Persistent difficulties catching breath
when active.

EXAM:
CHEST - 2 VIEW

[chest pa]
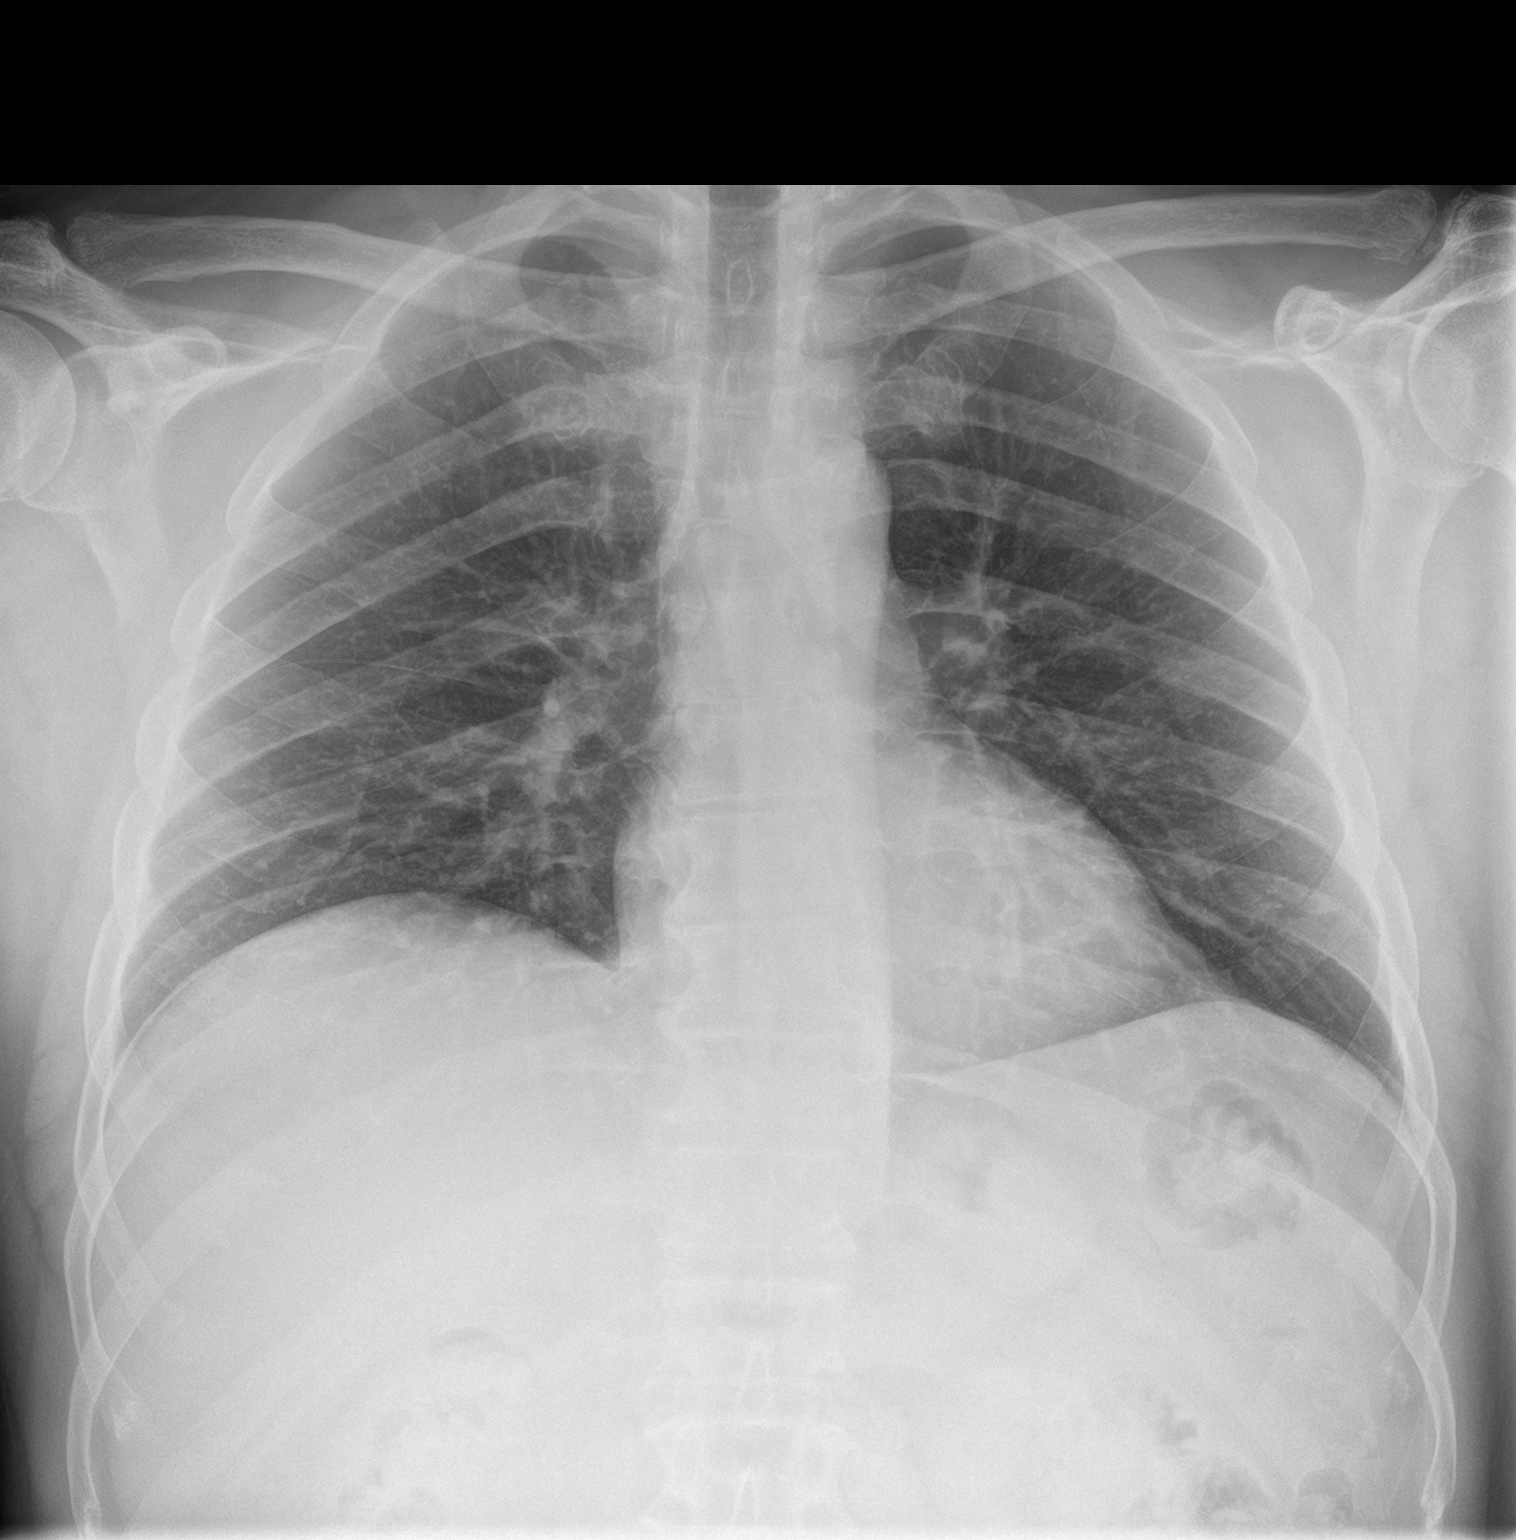

[chest lat]
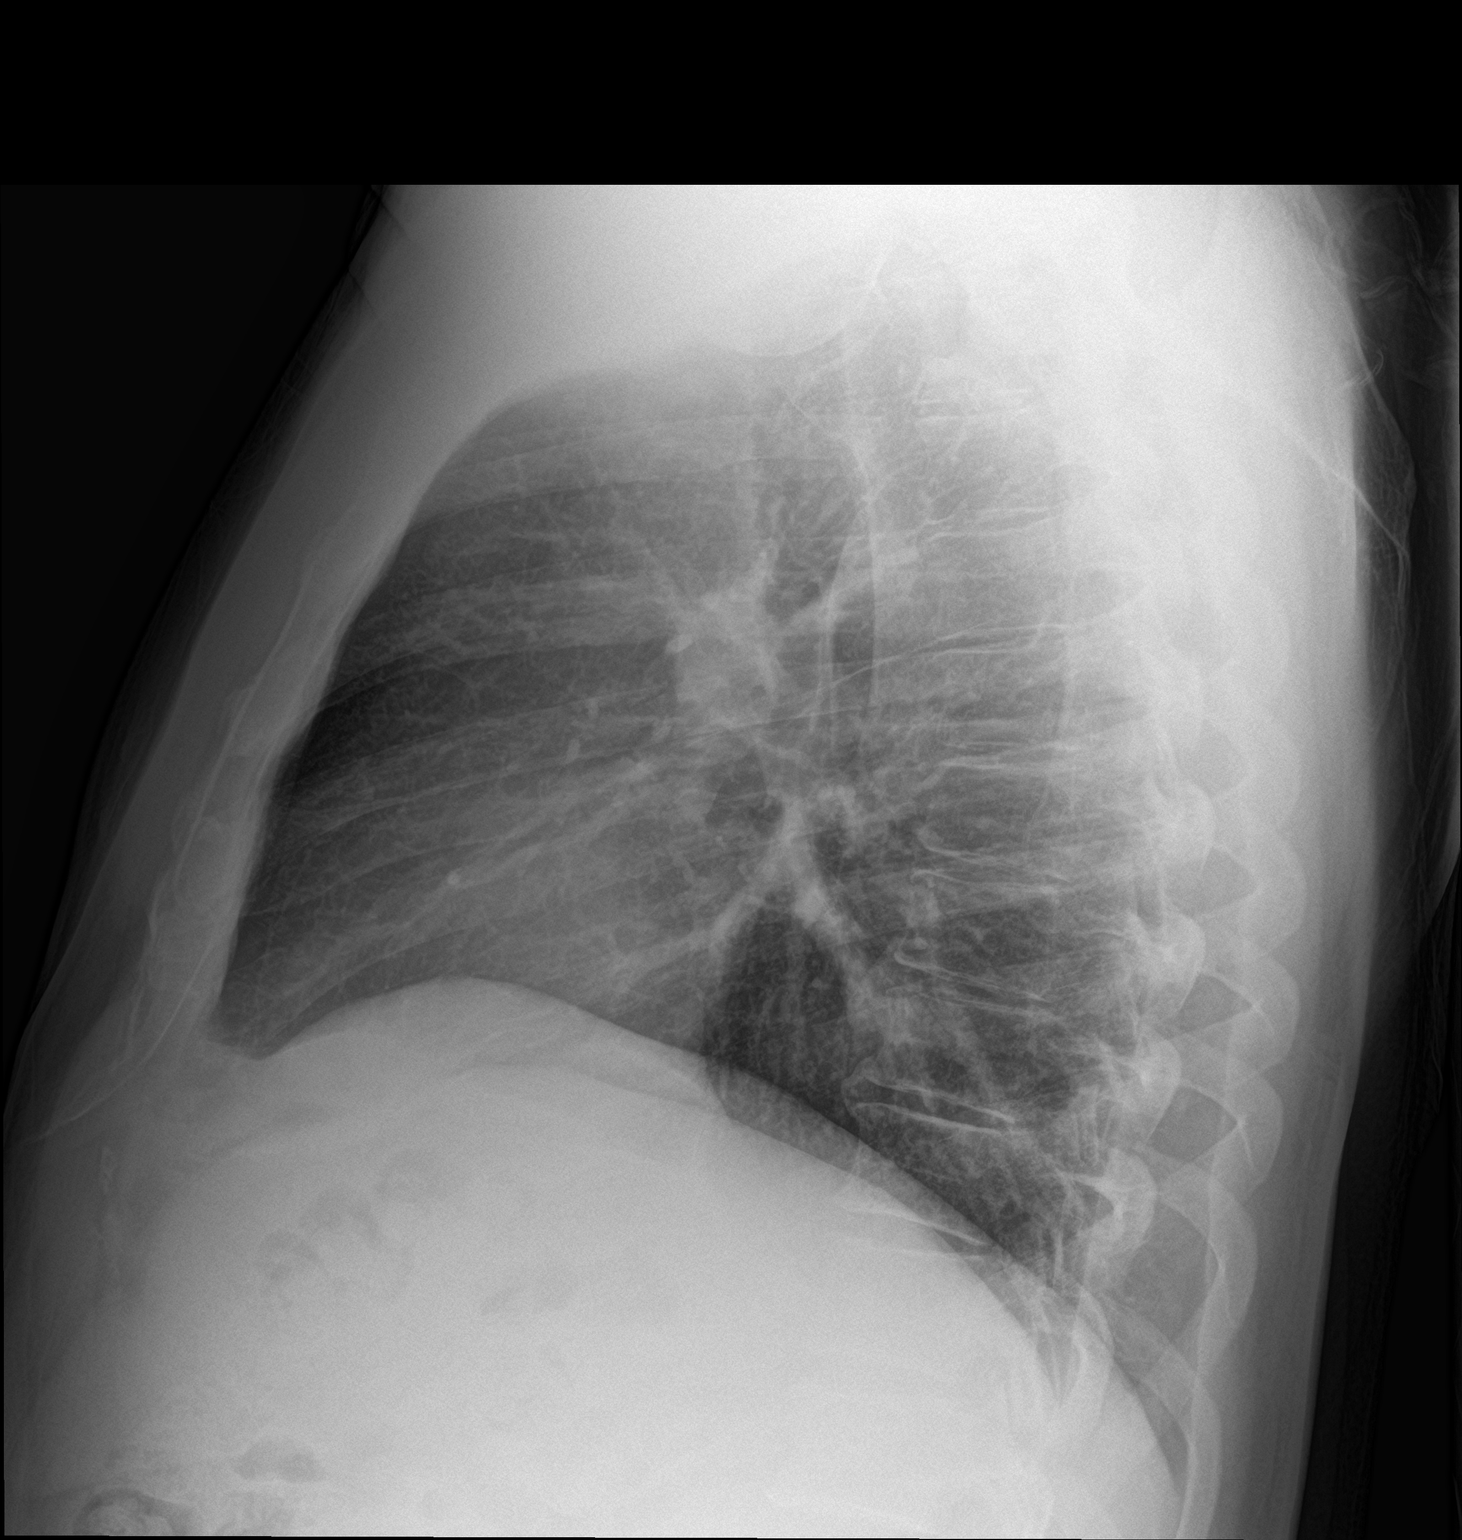

[2 of 2 positions shown; findings below may reference images not displayed]

FINDINGS: Previous patchy bilateral airspace disease has resolved. No
residual. No evidence of fibrosis. Normal heart size and mediastinal
contours. No pulmonary edema, pleural effusion, or pneumothorax. No
acute osseous abnormalities are seen.
IMPRESSION: Resolved bilateral airspace disease. No evidence of acute airspace
disease or post COVID fibrosis.

## 2023-05-04 ENCOUNTER — Encounter (HOSPITAL_BASED_OUTPATIENT_CLINIC_OR_DEPARTMENT_OTHER): Payer: Self-pay | Admitting: Orthopedic Surgery
